# Patient Record
Sex: Female | Born: 1990 | Race: White | Hispanic: No | Marital: Married | Smoking: Former smoker
Health system: Southern US, Community
[De-identification: ages and names within clinical notes are randomized; demographics above are authoritative.]

## PROBLEM LIST (undated history)

## (undated) ENCOUNTER — Inpatient Hospital Stay: Payer: Self-pay

## (undated) DIAGNOSIS — O24419 Gestational diabetes mellitus in pregnancy, unspecified control: Secondary | ICD-10-CM

## (undated) DIAGNOSIS — F99 Mental disorder, not otherwise specified: Secondary | ICD-10-CM

## (undated) HISTORY — PX: WISDOM TOOTH EXTRACTION: SHX21

## (undated) HISTORY — DX: Gestational diabetes mellitus in pregnancy, unspecified control: O24.419

---

## 2013-04-04 DIAGNOSIS — Z8742 Personal history of other diseases of the female genital tract: Secondary | ICD-10-CM | POA: Insufficient documentation

## 2013-11-04 ENCOUNTER — Emergency Department: Payer: Self-pay | Admitting: Emergency Medicine

## 2013-11-04 LAB — CBC
HCT: 42.4 % (ref 35.0–47.0)
HGB: 13.8 g/dL (ref 12.0–16.0)
MCH: 27.9 pg (ref 26.0–34.0)
MCHC: 32.6 g/dL (ref 32.0–36.0)
MCV: 86 fL (ref 80–100)
PLATELETS: 337 10*3/uL (ref 150–440)
RBC: 4.95 10*6/uL (ref 3.80–5.20)
RDW: 14.3 % (ref 11.5–14.5)
WBC: 11.6 10*3/uL — ABNORMAL HIGH (ref 3.6–11.0)

## 2013-11-04 LAB — URINALYSIS, COMPLETE
BILIRUBIN, UR: NEGATIVE
Glucose,UR: NEGATIVE mg/dL (ref 0–75)
Ketone: NEGATIVE
Leukocyte Esterase: NEGATIVE
NITRITE: NEGATIVE
PROTEIN: NEGATIVE
Ph: 6 (ref 4.5–8.0)
Specific Gravity: 1.009 (ref 1.003–1.030)

## 2013-11-04 LAB — HCG, QUANTITATIVE, PREGNANCY: Beta Hcg, Quant.: 2190 m[IU]/mL — ABNORMAL HIGH

## 2013-11-13 ENCOUNTER — Emergency Department: Payer: Self-pay | Admitting: Emergency Medicine

## 2013-11-13 LAB — BASIC METABOLIC PANEL
Anion Gap: 2 — ABNORMAL LOW (ref 7–16)
BUN: 9 mg/dL (ref 7–18)
CALCIUM: 8.7 mg/dL (ref 8.5–10.1)
Chloride: 105 mmol/L (ref 98–107)
Co2: 29 mmol/L (ref 21–32)
Creatinine: 0.87 mg/dL (ref 0.60–1.30)
EGFR (African American): >60
EGFR (Non-African Amer.): >60
Glucose: 89 mg/dL (ref 65–99)
Osmolality: 270 (ref 275–301)
Potassium: 3.9 mmol/L (ref 3.5–5.1)
SODIUM: 136 mmol/L (ref 136–145)

## 2013-11-13 LAB — CBC
HCT: 41.8 % (ref 35.0–47.0)
HGB: 13.8 g/dL (ref 12.0–16.0)
MCH: 28.2 pg (ref 26.0–34.0)
MCHC: 33 g/dL (ref 32.0–36.0)
MCV: 86 fL (ref 80–100)
Platelet: 301 10*3/uL (ref 150–440)
RBC: 4.89 10*6/uL (ref 3.80–5.20)
RDW: 14 % (ref 11.5–14.5)
WBC: 8.5 10*3/uL (ref 3.6–11.0)

## 2013-11-13 LAB — HCG, QUANTITATIVE, PREGNANCY: BETA HCG, QUANT.: 21 m[IU]/mL — AB

## 2016-08-19 ENCOUNTER — Observation Stay
Admission: EM | Admit: 2016-08-19 | Discharge: 2016-08-19 | Disposition: A | Discharge status: Home / Self Care | Payer: Medicaid Other | Attending: Obstetrics and Gynecology | Admitting: Obstetrics and Gynecology

## 2016-08-19 ENCOUNTER — Observation Stay: Payer: Medicaid Other

## 2016-08-19 ENCOUNTER — Encounter: Payer: Self-pay | Admitting: *Deleted

## 2016-08-19 DIAGNOSIS — O219 Vomiting of pregnancy, unspecified: Secondary | ICD-10-CM | POA: Diagnosis present

## 2016-08-19 DIAGNOSIS — Z6841 Body Mass Index (BMI) 40.0 and over, adult: Secondary | ICD-10-CM | POA: Diagnosis not present

## 2016-08-19 DIAGNOSIS — O26892 Other specified pregnancy related conditions, second trimester: Secondary | ICD-10-CM | POA: Insufficient documentation

## 2016-08-19 DIAGNOSIS — E876 Hypokalemia: Secondary | ICD-10-CM | POA: Diagnosis not present

## 2016-08-19 DIAGNOSIS — R109 Unspecified abdominal pain: Secondary | ICD-10-CM | POA: Diagnosis present

## 2016-08-19 DIAGNOSIS — N1330 Unspecified hydronephrosis: Secondary | ICD-10-CM | POA: Insufficient documentation

## 2016-08-19 DIAGNOSIS — Z3A23 23 weeks gestation of pregnancy: Secondary | ICD-10-CM | POA: Diagnosis not present

## 2016-08-19 DIAGNOSIS — O99282 Endocrine, nutritional and metabolic diseases complicating pregnancy, second trimester: Secondary | ICD-10-CM | POA: Diagnosis not present

## 2016-08-19 DIAGNOSIS — N9489 Other specified conditions associated with female genital organs and menstrual cycle: Secondary | ICD-10-CM | POA: Diagnosis present

## 2016-08-19 DIAGNOSIS — O99212 Obesity complicating pregnancy, second trimester: Secondary | ICD-10-CM | POA: Insufficient documentation

## 2016-08-19 LAB — CBC
HEMATOCRIT: 34.8 % — AB (ref 35.0–47.0)
HEMOGLOBIN: 12.1 g/dL (ref 12.0–16.0)
MCH: 28.5 pg (ref 26.0–34.0)
MCHC: 34.9 g/dL (ref 32.0–36.0)
MCV: 81.8 fL (ref 80.0–100.0)
Platelets: 305 10*3/uL (ref 150–440)
RBC: 4.26 MIL/uL (ref 3.80–5.20)
RDW: 14.6 % — ABNORMAL HIGH (ref 11.5–14.5)
WBC: 15.3 10*3/uL — AB (ref 3.6–11.0)

## 2016-08-19 LAB — COMPREHENSIVE METABOLIC PANEL
ALK PHOS: 78 U/L (ref 38–126)
ALT: 21 U/L (ref 14–54)
ANION GAP: 9 (ref 5–15)
AST: 38 U/L (ref 15–41)
Albumin: 3.1 g/dL — ABNORMAL LOW (ref 3.5–5.0)
BILIRUBIN TOTAL: 1.1 mg/dL (ref 0.3–1.2)
BUN: 8 mg/dL (ref 6–20)
CALCIUM: 8.6 mg/dL — AB (ref 8.9–10.3)
CO2: 23 mmol/L (ref 22–32)
Chloride: 105 mmol/L (ref 101–111)
Creatinine, Ser: 1.07 mg/dL — ABNORMAL HIGH (ref 0.44–1.00)
GFR calc Af Amer: >60 mL/min (ref >60)
GFR calc non Af Amer: >60 mL/min (ref >60)
GLUCOSE: 99 mg/dL (ref 65–99)
Potassium: 3 mmol/L — ABNORMAL LOW (ref 3.5–5.1)
SODIUM: 137 mmol/L (ref 135–145)
Total Protein: 7 g/dL (ref 6.5–8.1)

## 2016-08-19 LAB — URINALYSIS, COMPLETE (UACMP) WITH MICROSCOPIC
Bilirubin Urine: NEGATIVE
Glucose, UA: NEGATIVE mg/dL
Hgb urine dipstick: NEGATIVE
KETONES UR: 20 mg/dL — AB
LEUKOCYTES UA: NEGATIVE
Nitrite: NEGATIVE
PH: 7 (ref 5.0–8.0)
PROTEIN: NEGATIVE mg/dL
Specific Gravity, Urine: 1.005 (ref 1.005–1.030)

## 2016-08-19 MED ORDER — KCL-LACTATED RINGERS-D5W 20 MEQ/L IV SOLN
INTRAVENOUS | Status: AC
  Administered 2016-08-19 (×2): 500 mL/h via INTRAVENOUS
  Filled 2016-08-19 (×4): qty 1000

## 2016-08-19 MED ORDER — PROMETHAZINE HCL 25 MG/ML IJ SOLN
25.0000 mg | Freq: Once | INTRAMUSCULAR | Status: AC
  Administered 2016-08-19: 25 mg via INTRAVENOUS
  Filled 2016-08-19 (×2): qty 1

## 2016-08-19 MED ORDER — LACTATED RINGERS IV SOLN
Freq: Once | INTRAVENOUS | Status: AC
  Administered 2016-08-19: 75 mL/h via INTRAVENOUS

## 2016-08-19 NOTE — OB Triage Note (Signed)
Call or return to [facility] with: ° °Strong, regular contractions °Leaking fluid °Decreased fetal movement °Vaginal bleeding that is heavy like a period °

## 2016-08-19 NOTE — Progress Notes (Signed)
Pt. off unit to radiology for U/S renal via radiology tech. Spouse at the bedside.

## 2016-08-19 NOTE — Discharge Summary (Signed)
  Suzy Bouchardhomas J Saige Busby, MD  Obstetrics    [] Hide copied text [] Hover for attribution information Patient ID: Erin HasteKathleen Tanimoto, female   DOB: 04/05/1991, 26 y.o.   MRN: [id number] Erin HasteKathleen Robb 01/04/1991 G3 P0 6512w4d presents for uterine cramping , n/v with 3 bouts of emesis. [facility] . Unassigned patient  noLOF , no vaginal bleeding , 38 hours of left flank pain . Poor po intake .  PMHX : obesity , h/o chlamydia , abn pap  PSHX : colposcopy , toe surgery  Pob Hx : SABx 2  Social No etoh , no tobacco  O;BP 129/60 (BP Location: Left Arm)   Pulse 83   Temp 98.4 F (36.9 C) (Oral)   Resp 18   Ht 5\' 3"  (1.6 m)   Wt 236 lb (107 kg)   BMI 41.81 kg/m  ABDsoft NT , no rebound CX long and closed , no blood  NST reassuring fetal monitoring 23 weeks  Labs:cbc: wbc =15K , HCT= 34.8% Cmp:K+=3.0, bun/cr 8/1.07, the rest is nl UA = neg  Renal u/s Mild hydronephrosis bilat A: Left flank and adb pain . No excessive hydronephrosis, no PTL  Hypokalemia with  Emesis Possible M/S etiology to left flank pain  P:IVF 1 liter with KCL  Add flexeril 10 mg TID prn flank pain  zofran 8 mg po q8 hrs prn n/v Add K+ rich foods  [facility]    Electronically signed by

## 2016-08-19 NOTE — OB Triage Note (Signed)
Pt. Here for worsening  abd. and back pain. Pain 7/10. Pain started around 1300 yesterday. Pt. Verbalized difficulty voiding, "I can only give you a few drops."  Pt. Also complaining of vomiting x4. Last sexual encounter was two nights ago. Denies sudden gush of fluid or vaginal bleeding. Positive for fetal movement.  EFM applied FHR 150 bpm, Toco applied - abd. Soft.

## 2016-08-19 NOTE — OB Triage Note (Signed)
Talked with MD about patient plan of care.  MD wrote prescription for zofran and inputted discharger orders.  He discussed plan of care with patient and she is in agreement.

## 2016-08-19 NOTE — Discharge Instructions (Signed)
Abdominal Pain During Pregnancy Belly (abdominal) pain is common during pregnancy. Most of the time, it is not a serious problem. Other times, it can be a sign that something is wrong with the pregnancy. Always tell your doctor if you have belly pain. Follow these instructions at home: Monitor your belly pain for any changes. The following actions may help you feel better:  Do not have sex (intercourse) or put anything in your vagina until you feel better.  Rest until your pain stops.  Drink clear fluids if you feel sick to your stomach (nauseous). Do not eat solid food until you feel better.  Only take medicine as told by your doctor.  Keep all doctor visits as told. Get help right away if:  You are bleeding, leaking fluid, or pieces of tissue come out of your vagina.  You have more pain or cramping.  You keep throwing up (vomiting).  You have pain when you pee (urinate) or have blood in your pee.  You have a fever.  You do not feel your baby moving as much.  You feel very weak or feel like passing out.  You have trouble breathing, with or without belly pain.  You have a very bad headache and belly pain.  You have fluid leaking from your vagina and belly pain.  You keep having watery poop (diarrhea).  Your belly pain does not go away after resting, or the pain gets worse. This information is not intended to replace advice given to you by your health care provider. Make sure you discuss any questions you have with your health care provider. Document Released: 07/14/2009 Document Revised: 03/03/2016 Document Reviewed: 02/22/2013 Elsevier Interactive Patient Education  2017 [employer]   Call or return to [facility] with:  Strong, regular contractions Leaking fluid Decreased fetal movement Vaginal bleeding that is heavy like a period

## 2016-08-19 NOTE — Progress Notes (Signed)
Patient ID: Erin Leon, female   DOB: 07/17/1991, 26 y.o.   MRN: [id number] Erin Leon 11/01/1990 G3 P0 156w4d presents for uterine cramping , n/v with 3 bouts of emesis. [facility] . Unassigned patient  noLOF , no vaginal bleeding , 38 hours of left flank pain . Poor po intake .  PMHX : obesity , h/o chlamydia , abn pap  PSHX : colposcopy , toe surgery  Pob Hx : SABx 2  Social No etoh , no tobacco  O;BP 129/60 (BP Location: Left Arm)   Pulse 83   Temp 98.4 F (36.9 C) (Oral)   Resp 18   Ht 5\' 3"  (1.6 m)   Wt 236 lb (107 kg)   BMI 41.81 kg/m  ABDsoft NT , no rebound CX long and closed , no blood  NST reassuring fetal monitoring 23 weeks  Labs:cbc: wbc =15K , HCT= 34.8% Cmp:K+=3.0, bun/cr 8/1.07, the rest is nl UA = neg  Renal u/s Mild hydronephrosis bilat A: Left flank and adb pain . No excessive hydronephrosis, no PTL  Hypokalemia with  Emesis Possible M/S etiology to left flank pain  P:IVF 1 liter with KCL  Add flexeril 10 mg TID prn flank pain  zofran 8 mg po q8 hrs prn n/v Add K+ rich foods  [facility]

## 2016-08-20 MED ORDER — ACETAMINOPHEN 500 MG PO TABS
ORAL_TABLET | ORAL | Status: AC
  Filled 2016-08-20: qty 1

## 2016-08-20 MED ORDER — FAMOTIDINE 20 MG PO TABS
ORAL_TABLET | ORAL | Status: AC
  Filled 2016-08-20: qty 1

## 2017-01-27 DIAGNOSIS — Z8659 Personal history of other mental and behavioral disorders: Secondary | ICD-10-CM | POA: Insufficient documentation

## 2017-06-13 ENCOUNTER — Encounter (HOSPITAL_COMMUNITY): Payer: Self-pay

## 2020-11-07 ENCOUNTER — Ambulatory Visit (LOCAL_COMMUNITY_HEALTH_CENTER): Payer: Self-pay

## 2020-11-07 ENCOUNTER — Other Ambulatory Visit: Payer: Self-pay

## 2020-11-07 DIAGNOSIS — Z111 Encounter for screening for respiratory tuberculosis: Secondary | ICD-10-CM

## 2020-11-10 ENCOUNTER — Ambulatory Visit (LOCAL_COMMUNITY_HEALTH_CENTER): Payer: Self-pay

## 2020-11-10 ENCOUNTER — Other Ambulatory Visit: Payer: Self-pay

## 2020-11-10 DIAGNOSIS — Z111 Encounter for screening for respiratory tuberculosis: Secondary | ICD-10-CM

## 2020-11-10 LAB — TB SKIN TEST
Induration: 0 mm
TB Skin Test: NEGATIVE

## 2021-07-29 ENCOUNTER — Other Ambulatory Visit: Payer: Self-pay | Admitting: Certified Nurse Midwife

## 2021-07-29 DIAGNOSIS — N63 Unspecified lump in unspecified breast: Secondary | ICD-10-CM

## 2021-08-09 NOTE — L&D Delivery Note (Signed)
Delivery Note  Erin Leon is a [id number] at [redacted]w[redacted]d, Patient's last menstrual period was 09/05/2021 (approximate)., consistent with [place] at [redacted]w[redacted]d.   First Stage: Labor onset: 1600 Induction: misoprostol, oxytocin, and AROM Analgesia /Anesthesia intrapartum: Epidural SROM at 1601 GBS: negative  Second Stage: Complete dilation at 2038 Onset of pushing at 2039 FHR second stage 120 bpm with moderate, variable and early decels with pushing   Erin Leon presented to L&D for scheduled IOL d/t GDM controlled with metformin.  Misoprostol was used for cervical ripening.  Oxytocin was stopped for 2 hours after 14 hours of infusion and minimal cervical change.  Slow labor progression started when oxytocin was reinitiated.  More active progression occurred after SROM during a cervical exam. She progressed to C/C/+2 with a urge to push.  Erin Leon pushed effectively over approximately 5 minutes for a spontaneous vaginal birth.  Delivery of a viable baby boy on 06/12/2022 at 2045 by CNM Delivery of fetal head in OA position with restitution to ROT. Loose nuchal cord x 1, easily reduced;  Anterior then posterior shoulders delivered easily with gentle downward traction. Baby placed on mom's chest, and attended to by baby RN Cord double clamped after cessation of pulsation, cut by father of baby  Cord blood sample collection: Yes O POS Performed at [facility], [address] [place], [place] [address]  Third Stage: Oxytocin bolus started after delivery of infant for hemorrhage prophylaxis  Placenta delivered intact with 3 VC @ 2055 Placenta disposition: discarded  Uterine tone firm / bleeding small  Small periurethral abrasion identified  Anesthesia for repair: N/A Repair not indicated  Est. Blood Loss (mL): 381 ml  Complications: None  Mom to postpartum.  Baby to Couplet care / Skin to Skin.  Newborn: Information for the patient's newborn:  Erin Leon, Erin Leon [[id number]]  Live born  female Erin Leon  Birth Weight:  pending  APGAR: 8, 9  Newborn Delivery   Birth date/time: 06/12/2022 20:45:00 Delivery type: Vaginal, Spontaneous     Feeding planned: breast feeding  ---------- Drinda Butts, CNM Certified Nurse Midwife [facility]

## 2021-08-13 ENCOUNTER — Other Ambulatory Visit: Payer: Self-pay

## 2021-08-13 ENCOUNTER — Ambulatory Visit
Admission: RE | Admit: 2021-08-13 | Discharge: 2021-08-13 | Disposition: A | Discharge status: Home / Self Care | Payer: 59 | Source: Ambulatory Visit | Attending: Certified Nurse Midwife | Admitting: Certified Nurse Midwife

## 2021-08-13 DIAGNOSIS — N63 Unspecified lump in unspecified breast: Secondary | ICD-10-CM

## 2021-11-05 DIAGNOSIS — O0993 Supervision of high risk pregnancy, unspecified, third trimester: Secondary | ICD-10-CM | POA: Insufficient documentation

## 2021-11-05 DIAGNOSIS — O09291 Supervision of pregnancy with other poor reproductive or obstetric history, first trimester: Secondary | ICD-10-CM | POA: Insufficient documentation

## 2021-12-03 DIAGNOSIS — O99213 Obesity complicating pregnancy, third trimester: Secondary | ICD-10-CM | POA: Insufficient documentation

## 2021-12-03 LAB — OB RESULTS CONSOLE HIV ANTIBODY (ROUTINE TESTING): HIV: NONREACTIVE

## 2021-12-03 LAB — OB RESULTS CONSOLE RUBELLA ANTIBODY, IGM: Rubella: IMMUNE

## 2021-12-03 LAB — OB RESULTS CONSOLE ABO/RH: RH Type: POSITIVE

## 2021-12-03 LAB — OB RESULTS CONSOLE VARICELLA ZOSTER ANTIBODY, IGG: Varicella: IMMUNE

## 2021-12-03 LAB — OB RESULTS CONSOLE GC/CHLAMYDIA
Chlamydia: NEGATIVE
Neisseria Gonorrhea: NEGATIVE

## 2021-12-03 LAB — OB RESULTS CONSOLE HEPATITIS B SURFACE ANTIGEN: Hepatitis B Surface Ag: NEGATIVE

## 2021-12-03 LAB — OB RESULTS CONSOLE RPR: RPR: NONREACTIVE

## 2022-01-08 DIAGNOSIS — O24415 Gestational diabetes mellitus in pregnancy, controlled by oral hypoglycemic drugs: Secondary | ICD-10-CM | POA: Insufficient documentation

## 2022-01-18 ENCOUNTER — Encounter: Payer: Self-pay | Admitting: *Deleted

## 2022-01-18 ENCOUNTER — Encounter: Payer: Medicaid Other | Admitting: *Deleted

## 2022-01-18 VITALS — BP 100/70 | Ht 63.0 in | Wt 243.8 lb

## 2022-01-18 DIAGNOSIS — O24410 Gestational diabetes mellitus in pregnancy, diet controlled: Secondary | ICD-10-CM | POA: Diagnosis not present

## 2022-01-18 DIAGNOSIS — Z713 Dietary counseling and surveillance: Secondary | ICD-10-CM | POA: Insufficient documentation

## 2022-01-18 DIAGNOSIS — Z3A21 21 weeks gestation of pregnancy: Secondary | ICD-10-CM | POA: Diagnosis not present

## 2022-01-18 NOTE — Patient Instructions (Signed)
Read booklet on Gestational Diabetes Follow Gestational Meal Planning Guidelines Don't skip meals Avoid sugar sweetened drinks (juice, soda) Complete a 3 Day Food Record and bring to next appointment Check blood sugars 4 x day - before breakfast and 2 hrs after every meal and record  Bring blood sugar log to all appointments Purchase urine ketone strips if instructed by MD and check urine ketones every am:  If + increase bedtime snack to 1 protein and 2 carbohydrate servings Walk 20-30 minutes at least 5 x week if permitted by MD

## 2022-01-18 NOTE — Progress Notes (Signed)
Diabetes Self-Management Education  Visit Type: First/Initial  Appt. Start Time: 1330 Appt. End Time: 1440  01/18/2022  [name], identified by name and date of birth, is a 31 y.o. female with a diagnosis of Diabetes: Gestational Diabetes.   ASSESSMENT  Blood pressure 100/70, height 5\' 3"  (1.6 m), weight 243 lb 12.8 oz (110.6 kg), last menstrual period 09/05/2021, estimated date of delivery 06/12/2022. Body mass index is 43.19 kg/m.   Diabetes Self-Management Education - 01/18/22 1521       Visit Information   Visit Type First/Initial      Initial Visit   Diabetes Type Gestational Diabetes    Date Diagnosed 1 week    Are you currently following a meal plan? Yes    What type of meal plan do you follow? "cut carbs down, choosing alternative breads and crusts"    Are you taking your medications as prescribed? Yes      Health Coping   How would you rate your overall health? Good      Psychosocial Assessment   Patient Belief/Attitude about Diabetes Other (comment)   "fine"   What is the hardest part about your diabetes right now, causing you the most concern, or is the most worrisome to you about your diabetes?   Making healty food and beverage choices    Self-care barriers None    Self-management support Doctor's office;Family    Patient Concerns Nutrition/Meal planning;Weight Control;Glycemic Control;Healthy Lifestyle    Special Needs None    Preferred Learning Style Visual;Hands on;Other (comment)   talking/discussion   What is the last grade level you completed in school? some college      Pre-Education Assessment   Patient understands the diabetes disease and treatment process. Needs Instruction    Patient understands incorporating nutritional management into lifestyle. Needs Instruction    Patient undertands incorporating physical activity into lifestyle. Needs Instruction    Patient understands using medications safely. Needs Instruction    Patient  understands monitoring blood glucose, interpreting and using results Needs Review    Patient understands prevention, detection, and treatment of acute complications. Needs Instruction    Patient understands prevention, detection, and treatment of chronic complications. Needs Instruction    Patient understands how to develop strategies to address psychosocial issues. Needs Instruction    Patient understands how to develop strategies to promote health/change behavior. Needs Instruction      Complications   Last HgB A1C per patient/outside source 5.6 %   12/03/2021   How often do you check your blood sugar? 3-4 times/day    Fasting Blood glucose range (mg/dL) 12/05/2021   73-710 mg/dL 5/6 elevated   Postprandial Blood glucose range (mg/dL) 62-694   pp breakfast 104 mg/dL - 0/1 elevated; pp lunch 96-126 mg/dL - 2/5 elevated; pp supper 102-148 mg/dL - 3/4 elevated   Have you had a dilated eye exam in the past 12 months? No    Have you had a dental exam in the past 12 months? Yes    Are you checking your feet? Yes    How many days per week are you checking your feet? 7      Dietary Intake   Breakfast skips    Snack (morning) 0-1 snacks/day veggies and dressing; pickles    Lunch frozen meals; 85-462;703-500 and cheese sandwich    Dinner some times Hello Fresh meals; chicken, steak, pork; potatoes, peas, beans, rice, corn, pasta, green beans, broccoli, cauliflower, squahs, zucchini, salads, cauliflower pizza  Beverage(s) water, juice, diet soda - recently stopped drinking Pepsi      Activity / Exercise   Activity / Exercise Type ADL's      Patient Education   Previous Diabetes Education No    Disease Pathophysiology Definition of diabetes, type 1 and 2, and the diagnosis of diabetes;Factors that contribute to the development of diabetes    Healthy Eating Role of diet in the treatment of diabetes and the relationship between the three main macronutrients and blood glucose level;Food label reading,  portion sizes and measuring food.;Reviewed blood glucose goals for pre and post meals and how to evaluate the patients' food intake on their blood glucose level.    Being Active Role of exercise on diabetes management, blood pressure control and cardiac health.    Medications Other (comment)   Limited use of oral medications during pregnancy and potential for insulin.   Monitoring Purpose and frequency of SMBG.;Taught/discussed recording of test results and interpretation of SMBG.;Identified appropriate SMBG and/or A1C goals.;Ketone testing, when, how.    Chronic complications Relationship between chronic complications and blood glucose control    Diabetes Stress and Support Identified and addressed patients feelings and concerns about diabetes    Preconception care Pregnancy and GDM  Role of pre-pregnancy blood glucose control on the development of the fetus;Reviewed with patient blood glucose goals with pregnancy;Role of family planning for patients with diabetes      Individualized Goals (developed by patient)   Reducing Risk Other (comment)   improve blood sugars, lose weight, lead a healthier lifestyle     Outcomes   Expected Outcomes Demonstrated interest in learning. Expect positive outcomes         Individualized Plan for Diabetes Self-Management Training:   Learning Objective:  Patient will have a greater understanding of diabetes self-management. Patient education plan is to attend individual and/or group sessions per assessed needs and concerns.   Plan:   Patient Instructions  Read booklet on Gestational Diabetes Follow Gestational Meal Planning Guidelines Don't skip meals Avoid sugar sweetened drinks (juice, soda) Complete a 3 Day Food Record and bring to next appointment Check blood sugars 4 x day - before breakfast and 2 hrs after every meal and record  Bring blood sugar log to all appointments Purchase urine ketone strips if instructed by MD and check urine ketones  every am:  If + increase bedtime snack to 1 protein and 2 carbohydrate servings Walk 20-30 minutes at least 5 x week if permitted by MD   Expected Outcomes:  Demonstrated interest in learning. Expect positive outcomes  Education material provided:  Gestational Booklet Gestational Meal Planning Guidelines Simple Meal Plan 3 Day Food Record Goals for a Healthy Pregnancy   If problems or questions, patient to contact team via:   Sharion Settler, RN, CCM, CDCES [phone]  Future DSME appointment: January 28, 2022 with the dietitian

## 2022-01-28 ENCOUNTER — Encounter: Payer: Self-pay | Admitting: Dietician

## 2022-01-28 ENCOUNTER — Encounter: Payer: Medicaid Other | Admitting: Dietician

## 2022-01-28 VITALS — BP 108/66 | Ht 63.0 in | Wt 244.2 lb

## 2022-01-28 DIAGNOSIS — O24410 Gestational diabetes mellitus in pregnancy, diet controlled: Secondary | ICD-10-CM

## 2022-01-28 DIAGNOSIS — Z713 Dietary counseling and surveillance: Secondary | ICD-10-CM | POA: Diagnosis not present

## 2022-01-28 NOTE — Patient Instructions (Signed)
Continue to practice portion control while on vacation, great job so far! Include at least a few minutes of light activity such as walking after meals to keep post-meal sugars in  normal range. Adjust amounts of protein foods and low carb veggies as needed to help with fulness at meals.

## 2022-05-24 LAB — OB RESULTS CONSOLE GC/CHLAMYDIA
Chlamydia: NEGATIVE
Neisseria Gonorrhea: NEGATIVE

## 2022-05-24 LAB — OB RESULTS CONSOLE GBS: GBS: NEGATIVE

## 2022-05-24 LAB — OB RESULTS CONSOLE HIV ANTIBODY (ROUTINE TESTING): HIV: NONREACTIVE

## 2022-05-24 LAB — OB RESULTS CONSOLE RPR: RPR: NONREACTIVE

## 2022-06-03 ENCOUNTER — Other Ambulatory Visit: Payer: Self-pay | Admitting: Certified Nurse Midwife

## 2022-06-03 DIAGNOSIS — Z3490 Encounter for supervision of normal pregnancy, unspecified, unspecified trimester: Secondary | ICD-10-CM

## 2022-06-03 NOTE — Progress Notes (Signed)
[id number] at [redacted]w[redacted]d, LMP of 09/05/21, c/w early [place] at 108w5d.  Scheduled for induction of labor for GDM on 06/11/22.   Prenatal provider: [facility] Pregnancy complicated by: GDM A1  Obesity History of IUGR History of PPD  Prenatal Labs: Blood type/Rh O Pos  Antibody screen neg  Rubella Immune    Varicella Immune  RPR NR  HBsAg   Nef  Hep C NR  HIV   NR  GC neg  Chlamydia neg  Genetic screening cfDNA negative  1 hour GTT N/A  3 hour GTT 97, 157, 187, 157  GBS   Neg   Tdap: 03/23/22 Flu: 06/03/22 Contraception: Vasectomy Feeding preference: breast feeding  ____ Avelino Leeds, CNM Certified Nurse Midwife [facility]

## 2022-06-11 ENCOUNTER — Other Ambulatory Visit: Payer: Self-pay

## 2022-06-11 ENCOUNTER — Inpatient Hospital Stay
Admission: RE | Admit: 2022-06-11 | Discharge: 2022-06-14 | DRG: 806 | Disposition: A | Discharge status: Home / Self Care | Payer: Medicaid Other

## 2022-06-11 ENCOUNTER — Encounter: Payer: Self-pay | Admitting: *Deleted

## 2022-06-11 DIAGNOSIS — O99893 Other specified diseases and conditions complicating puerperium: Secondary | ICD-10-CM | POA: Diagnosis not present

## 2022-06-11 DIAGNOSIS — O99214 Obesity complicating childbirth: Secondary | ICD-10-CM | POA: Diagnosis present

## 2022-06-11 DIAGNOSIS — I959 Hypotension, unspecified: Secondary | ICD-10-CM | POA: Diagnosis not present

## 2022-06-11 DIAGNOSIS — O9081 Anemia of the puerperium: Secondary | ICD-10-CM | POA: Diagnosis not present

## 2022-06-11 DIAGNOSIS — Z87891 Personal history of nicotine dependence: Secondary | ICD-10-CM | POA: Diagnosis not present

## 2022-06-11 DIAGNOSIS — O24425 Gestational diabetes mellitus in childbirth, controlled by oral hypoglycemic drugs: Principal | ICD-10-CM | POA: Diagnosis present

## 2022-06-11 DIAGNOSIS — D62 Acute posthemorrhagic anemia: Secondary | ICD-10-CM | POA: Diagnosis not present

## 2022-06-11 DIAGNOSIS — Z3A39 39 weeks gestation of pregnancy: Secondary | ICD-10-CM

## 2022-06-11 DIAGNOSIS — Z3490 Encounter for supervision of normal pregnancy, unspecified, unspecified trimester: Principal | ICD-10-CM | POA: Diagnosis present

## 2022-06-11 HISTORY — DX: Mental disorder, not otherwise specified: F99

## 2022-06-11 LAB — CBC
HCT: 36.9 % (ref 36.0–46.0)
Hemoglobin: 12.6 g/dL (ref 12.0–15.0)
MCH: 27.7 pg (ref 26.0–34.0)
MCHC: 34.1 g/dL (ref 30.0–36.0)
MCV: 81.1 fL (ref 80.0–100.0)
Platelets: 258 10*3/uL (ref 150–400)
RBC: 4.55 MIL/uL (ref 3.87–5.11)
RDW: 13.7 % (ref 11.5–15.5)
WBC: 9.4 10*3/uL (ref 4.0–10.5)
nRBC: 0 % (ref 0.0–0.2)

## 2022-06-11 LAB — GLUCOSE, CAPILLARY
Glucose-Capillary: 103 mg/dL — ABNORMAL HIGH (ref 70–99)
Glucose-Capillary: 95 mg/dL (ref 70–99)
Glucose-Capillary: 120 mg/dL — ABNORMAL HIGH (ref 70–99)

## 2022-06-11 LAB — TYPE AND SCREEN
ABO/RH(D): O POS
Antibody Screen: NEGATIVE

## 2022-06-11 LAB — ABO/RH: ABO/RH(D): O POS

## 2022-06-11 MED ORDER — MISOPROSTOL 200 MCG PO TABS
ORAL_TABLET | ORAL | Status: AC
  Filled 2022-06-11: qty 4

## 2022-06-11 MED ORDER — METFORMIN HCL 500 MG PO TABS
500.0000 mg | ORAL_TABLET | Freq: Every day | ORAL | Status: DC
  Administered 2022-06-11: 500 mg via ORAL
  Filled 2022-06-11: qty 1

## 2022-06-11 MED ORDER — PRENATAL MULTIVITAMIN CH
1.0000 | ORAL_TABLET | Freq: Every day | ORAL | Status: DC
  Administered 2022-06-11: 1 via ORAL

## 2022-06-11 MED ORDER — MISOPROSTOL 25 MCG QUARTER TABLET
ORAL_TABLET | ORAL | Status: AC
  Administered 2022-06-11: 25 ug via ORAL
  Filled 2022-06-11: qty 2

## 2022-06-11 MED ORDER — AMMONIA AROMATIC IN INHA
RESPIRATORY_TRACT | Status: AC
  Filled 2022-06-11: qty 10

## 2022-06-11 MED ORDER — LIDOCAINE HCL (PF) 1 % IJ SOLN: 30.0000 mL | INTRAMUSCULAR | Status: DC | PRN

## 2022-06-11 MED ORDER — MISOPROSTOL 25 MCG QUARTER TABLET
25.0000 ug | ORAL_TABLET | Freq: Once | ORAL | Status: AC
  Administered 2022-06-11: 25 ug via VAGINAL

## 2022-06-11 MED ORDER — LIDOCAINE HCL (PF) 1 % IJ SOLN
INTRAMUSCULAR | Status: AC
  Filled 2022-06-11: qty 30

## 2022-06-11 MED ORDER — OXYTOCIN 10 UNIT/ML IJ SOLN
INTRAMUSCULAR | Status: AC
  Filled 2022-06-11: qty 2

## 2022-06-11 MED ORDER — TERBUTALINE SULFATE 1 MG/ML IJ SOLN: 0.2500 mg | Freq: Once | INTRAMUSCULAR | Status: DC | PRN

## 2022-06-11 MED ORDER — FENTANYL CITRATE (PF) 100 MCG/2ML IJ SOLN
50.0000 ug | INTRAMUSCULAR | Status: DC | PRN
  Administered 2022-06-12 (×2): 100 ug via INTRAVENOUS
  Filled 2022-06-11 (×2): qty 2

## 2022-06-11 MED ORDER — MISOPROSTOL 25 MCG QUARTER TABLET: 25.0000 ug | ORAL_TABLET | Freq: Once | ORAL | Status: AC

## 2022-06-11 MED ORDER — MISOPROSTOL 25 MCG QUARTER TABLET
25.0000 ug | ORAL_TABLET | Freq: Once | ORAL | Status: AC
  Administered 2022-06-11: 25 ug via VAGINAL
  Filled 2022-06-11: qty 1

## 2022-06-11 MED ORDER — ACETAMINOPHEN 325 MG PO TABS
650.0000 mg | ORAL_TABLET | ORAL | Status: DC | PRN
  Administered 2022-06-11 (×2): 650 mg via ORAL
  Filled 2022-06-11 (×2): qty 2

## 2022-06-11 MED ORDER — PANTOPRAZOLE SODIUM 20 MG PO TBEC
20.0000 mg | DELAYED_RELEASE_TABLET | Freq: Every day | ORAL | Status: DC
  Administered 2022-06-11: 20 mg via ORAL
  Filled 2022-06-11: qty 1

## 2022-06-11 MED ORDER — SOD CITRATE-CITRIC ACID 500-334 MG/5ML PO SOLN: 30.0000 mL | ORAL | Status: DC | PRN

## 2022-06-11 MED ORDER — LACTATED RINGERS IV SOLN: INTRAVENOUS | Status: DC

## 2022-06-11 MED ORDER — LACTATED RINGERS IV SOLN: 500.0000 mL | INTRAVENOUS | Status: DC | PRN

## 2022-06-11 MED ORDER — MISOPROSTOL 25 MCG QUARTER TABLET
25.0000 ug | ORAL_TABLET | Freq: Once | ORAL | Status: AC
  Administered 2022-06-11: 25 ug via ORAL
  Filled 2022-06-11: qty 1

## 2022-06-11 MED ORDER — ONDANSETRON HCL 4 MG/2ML IJ SOLN
4.0000 mg | Freq: Four times a day (QID) | INTRAMUSCULAR | Status: DC | PRN
  Administered 2022-06-12 (×2): 4 mg via INTRAVENOUS
  Filled 2022-06-11 (×2): qty 2

## 2022-06-11 MED ORDER — OXYTOCIN BOLUS FROM INFUSION
333.0000 mL | Freq: Once | INTRAVENOUS | Status: AC
  Administered 2022-06-12: 333 mL via INTRAVENOUS

## 2022-06-11 MED ORDER — OXYTOCIN-SODIUM CHLORIDE 30-0.9 UT/500ML-% IV SOLN
1.0000 m[IU]/min | INTRAVENOUS | Status: DC
  Administered 2022-06-11 – 2022-06-12 (×2): 2 m[IU]/min via INTRAVENOUS
  Filled 2022-06-11: qty 500

## 2022-06-11 MED ORDER — OXYTOCIN-SODIUM CHLORIDE 30-0.9 UT/500ML-% IV SOLN: 2.5000 [IU]/h | INTRAVENOUS | Status: DC

## 2022-06-11 MED ORDER — OXYTOCIN-SODIUM CHLORIDE 30-0.9 UT/500ML-% IV SOLN
INTRAVENOUS | Status: AC
  Filled 2022-06-11: qty 500

## 2022-06-11 NOTE — H&P (Signed)
OB History & Physical   History of Present Illness:  Chief Complaint:   HPI:  Erin Leon is a 31 y.o. [id number] female at [redacted]w[redacted]d dated by LMP of 09/05/21.  She presents to L&D for IOL for GDM  She reports:  -active fetal movement -no leakage of fluid  -no vaginal bleeding -no contractions  Pregnancy Issues: 1. GDM A1           Obesity History of IUGR History of PPD   Maternal Medical History:   Past Medical History:  Diagnosis Date   Gestational diabetes    Mental disorder     Past Surgical History:  Procedure Laterality Date   WISDOM TOOTH EXTRACTION      No Known Allergies  Prior to Admission medications   Medication Sig Start Date End Date Taking? Authorizing Provider  ACCU-CHEK GUIDE test strip 4 (four) times daily. 01/08/22  Yes [provider]  cetirizine (ZYRTEC) 10 MG tablet Take 10 mg by mouth daily.   Yes [provider]  metFORMIN (GLUCOPHAGE) 500 MG tablet Take by mouth. 01/28/22 01/28/23 Yes [provider]  omeprazole (PRILOSEC) 20 MG capsule Take 20 mg by mouth daily.   Yes [provider]  Prenatal Vit-Fe Fumarate-FA (MULTIVITAMIN-PRENATAL) 27-0.8 MG TABS tablet Take 1 tablet by mouth daily at 12 noon.   Yes [provider]     Prenatal care site: [place] History: She  reports that she quit smoking about 8 years ago. Her smoking use included cigarettes. She has a 0.50 pack-year smoking history. She has quit using smokeless tobacco. She reports that she does not drink alcohol and does not use drugs.  Family History: family history includes Breast cancer in her maternal grandfather and paternal grandmother; Diabetes in her maternal aunt and paternal grandfather.   Review of Systems: A full review of systems was performed and negative except as noted in the HPI.    Physical Exam:  Vital Signs: Ht 5\' 3"  (1.6 m)   Wt 111 kg   LMP 09/05/2021 (Approximate)   BMI 43.36 kg/m   General:    alert and cooperative  Skin:  normal  Neurologic:    Alert & oriented x 3  Lungs:    Nl effort  Heart:   regular rate and rhythm  Abdomen:  soft, non-tender; bowel sounds normal; no masses,  no organomegaly  Extremities: : non-tender, symmetric, no edema bilaterally.      EFW: 05/12/22: 5lb15oz(2693g)= 64%   Results for orders placed or performed during the hospital encounter of 06/11/22 (from the past 24 hour(s))  Glucose, capillary     Status: Abnormal   Collection Time: 06/11/22 10:12 AM  Result Value Ref Range   Glucose-Capillary 103 (H) 70 - 99 mg/dL  CBC     Status: None   Collection Time: 06/11/22 10:18 AM  Result Value Ref Range   WBC 9.4 4.0 - 10.5 K/uL   RBC 4.55 3.87 - 5.11 MIL/uL   Hemoglobin 12.6 12.0 - 15.0 g/dL   HCT 36.9 36.0 - 46.0 %   MCV 81.1 80.0 - 100.0 fL   MCH 27.7 26.0 - 34.0 pg   MCHC 34.1 30.0 - 36.0 g/dL   RDW 13.7 11.5 - 15.5 %   Platelets 258 150 - 400 K/uL   nRBC 0.0 0.0 - 0.2 %    Pertinent Results:  Prenatal Labs: Blood type/Rh O pos  Antibody screen neg  Rubella Immune  Varicella Immune  RPR NR  HBsAg Neg  HIV NR  GC neg  Chlamydia neg  Genetic screening negative  1 hour GTT   3 hour GTT   GBS Neg   FHT: FHR: 130 bpm, variability: moderate,  accelerations:  Present,  decelerations:  Absent Category/reactivity:  Category I TOCO: none SVE: Dilation: 1 / Effacement (%): 50 / Station: -3     Assessment:  Erin Leon is a 31 y.o. [id number] female at [redacted]w[redacted]d with GDM.   Plan:  1. Admit to Labor & Delivery; consents reviewed and obtained  2. Fetal Well being  - Fetal Tracing: Cat I - GBS neg - Presentation: vtx confirmed by sve   3. Routine OB: - Prenatal labs reviewed, as above - Rh pos - CBC & T&S on admit - Clear fluids, IVF  4. Induction of Labor -  Contractions by external toco in place -  Pelvis proven to 3030g -  Plan for induction with Cytotec and Pitocin -  Plan for continuous fetal monitoring  -  Maternal  pain control as desired: IVPM, nitrous, regional anesthesia - Anticipate vaginal delivery  5. Post Partum Planning: - Infant feeding: Breast - Contraception: Vasectomy - Tdap: given 03/23/22  - Flu: given 06/03/22   Linda Hedges, CNM 06/11/2022 10:43 AM

## 2022-06-11 NOTE — Progress Notes (Signed)
Labor Progress Note  Erin Leon is a 31 y.o. [id number] at [redacted]w[redacted]d by LMP admitted for induction of labor due to Gestational diabetes.  Subjective: Pt is reporting some mild UCs   Objective: BP 119/70 (BP Location: Left Arm)   Pulse 62   Temp 98.6 F (37 C) (Oral)   Resp 18   Ht 5\' 3"  (1.6 m)   Wt 111 kg   LMP 09/05/2021 (Approximate)   BMI 43.36 kg/m   Fetal Assessment: FHT:  FHR: 135 bpm, variability: moderate,  accelerations:  Present,  decelerations:  Absent Category/reactivity:  Category I UC:   regular, every 2-4 minutes SVE:    Dilation: 1cm  Effacement: 50%  Station:  -3  Consistency: soft  Position: posterior  Membrane status: Intact Amniotic color: n/a  Labs: Lab Results  Component Value Date   WBC 9.4 06/11/2022   HGB 12.6 06/11/2022   HCT 36.9 06/11/2022   MCV 81.1 06/11/2022   PLT 258 06/11/2022    Assessment / Plan: Induction of labor due to gestational diabetes 1030 68mcg PO and 48mcg PV 1525 103mcg PO and 74mcg PV  Labor: Progressing normally Preeclampsia:   122/76 Fetal Wellbeing:  Category I Pain Control:  Labor support without medications I/D:   Afebrile, GBS neg, Intact Anticipated MOD:  NSVD  Erin Leon, CNM 06/11/2022, 7:59 PM

## 2022-06-11 NOTE — Progress Notes (Signed)
Labor Progress Note  Erin Leon is a 31 y.o. [id number] at [redacted]w[redacted]d by LMP admitted for induction of labor due to Gestational diabetes.  Subjective: Pt is reporting some mild UCs   Objective: BP 119/70 (BP Location: Left Arm)   Pulse 62   Temp 98.6 F (37 C) (Oral)   Resp 18   Ht 5\' 3"  (1.6 m)   Wt 111 kg   LMP 09/05/2021 (Approximate)   BMI 43.36 kg/m   Fetal Assessment: FHT:  FHR: 140 bpm, variability: moderate,  accelerations:  Present,  decelerations:  Absent Category/reactivity:  Category I UC:   UI SVE:    Dilation: 1cm  Effacement: 50%  Station:  -3  Consistency: soft  Position: posterior  Membrane status: Intact Amniotic color: n/a  Labs: Lab Results  Component Value Date   WBC 9.4 06/11/2022   HGB 12.6 06/11/2022   HCT 36.9 06/11/2022   MCV 81.1 06/11/2022   PLT 258 06/11/2022    Assessment / Plan: Induction of labor due to gestational diabetes 1030 31mcg PO and 51mcg PV 1525 41mcg PO and 81mcg PV Will allow pt to shower before starting Pitocin  Labor: Progressing normally Preeclampsia:   119/70 Fetal Wellbeing:  Category I Pain Control:  Labor support without medications I/D:   Afebrile, GBS neg, Intact Anticipated MOD:  NSVD  Clydene Laming, CNM 06/11/2022, 8:05 PM

## 2022-06-12 ENCOUNTER — Inpatient Hospital Stay: Payer: Medicaid Other | Admitting: Anesthesiology

## 2022-06-12 ENCOUNTER — Encounter: Payer: Self-pay | Admitting: Obstetrics and Gynecology

## 2022-06-12 LAB — GLUCOSE, CAPILLARY
Glucose-Capillary: 95 mg/dL (ref 70–99)
Glucose-Capillary: 83 mg/dL (ref 70–99)
Glucose-Capillary: 81 mg/dL (ref 70–99)
Glucose-Capillary: 89 mg/dL (ref 70–99)

## 2022-06-12 MED ORDER — PHENYLEPHRINE 80 MCG/ML (10ML) SYRINGE FOR IV PUSH (FOR BLOOD PRESSURE SUPPORT): 80.0000 ug | PREFILLED_SYRINGE | INTRAVENOUS | Status: DC | PRN

## 2022-06-12 MED ORDER — LIDOCAINE-EPINEPHRINE (PF) 1.5 %-1:200000 IJ SOLN
INTRAMUSCULAR | Status: DC | PRN
  Administered 2022-06-12: 3 mL via EPIDURAL

## 2022-06-12 MED ORDER — SODIUM CHLORIDE 0.9 % IV SOLN
INTRAVENOUS | Status: DC | PRN
  Administered 2022-06-12 (×2): 5 mL via EPIDURAL

## 2022-06-12 MED ORDER — LIDOCAINE HCL (PF) 1 % IJ SOLN
INTRAMUSCULAR | Status: DC | PRN
  Administered 2022-06-12: 3 mL via SUBCUTANEOUS
  Administered 2022-06-12: 2 mL via SUBCUTANEOUS

## 2022-06-12 MED ORDER — EPHEDRINE 5 MG/ML INJ: 10.0000 mg | INTRAVENOUS | Status: DC | PRN

## 2022-06-12 MED ORDER — LACTATED RINGERS IV SOLN
500.0000 mL | Freq: Once | INTRAVENOUS | Status: AC
  Administered 2022-06-12: 500 mL via INTRAVENOUS

## 2022-06-12 MED ORDER — PHENYLEPHRINE 80 MCG/ML (10ML) SYRINGE FOR IV PUSH (FOR BLOOD PRESSURE SUPPORT)
PREFILLED_SYRINGE | INTRAVENOUS | Status: AC
  Administered 2022-06-12: 160 ug via INTRAVENOUS
  Filled 2022-06-12: qty 10

## 2022-06-12 MED ORDER — FENTANYL-BUPIVACAINE-NACL 0.5-0.125-0.9 MG/250ML-% EP SOLN
12.0000 mL/h | EPIDURAL | Status: DC | PRN
  Administered 2022-06-12: 12 mL/h via EPIDURAL

## 2022-06-12 MED ORDER — DIPHENHYDRAMINE HCL 50 MG/ML IJ SOLN: 12.5000 mg | INTRAMUSCULAR | Status: DC | PRN

## 2022-06-12 MED ORDER — CALCIUM CARBONATE ANTACID 500 MG PO CHEW
2.0000 | CHEWABLE_TABLET | Freq: Three times a day (TID) | ORAL | Status: DC | PRN
  Administered 2022-06-12: 400 mg via ORAL
  Filled 2022-06-12: qty 2

## 2022-06-12 MED ORDER — FENTANYL-BUPIVACAINE-NACL 0.5-0.125-0.9 MG/250ML-% EP SOLN
EPIDURAL | Status: AC
  Filled 2022-06-12: qty 250

## 2022-06-12 NOTE — Progress Notes (Addendum)
L&D Note    Subjective:  Recently received epidural, still feeling contractions but pain is starting to get better   Objective:   Vitals:   06/12/22 1855 06/12/22 1856 06/12/22 1900 06/12/22 1919  BP:  (!) 143/77 (!) 143/73 (!) 128/105  Pulse:  80 92 79  Resp:      Temp:      TempSrc:      SpO2: 99%  99%   Weight:      Height:        Current Vital Signs 24h Vital Sign Ranges  T 98.7 F (37.1 C) Temp  Avg: 98.6 F (37 C)  Min: 98.1 F (36.7 C)  Max: 98.9 F (37.2 C)  BP (!) 128/105 BP  Min: 113/50  Max: 143/73  HR 79 Pulse  Avg: 71.3  Min: 57  Max: 92  RR 16 Resp  Avg: 17.6  Min: 16  Max: 18  SaO2 99 %   SpO2  Avg: 99 %  Min: 99 %  Max: 99 %      Gen: alert, cooperative, no distress FHR: Baseline: 125 bpm, Variability: moderate, Accels: Present, Decels: prolong decel after epidural Toco: regular, every 2-4 minutes SVE: Dilation: 6.5 Effacement (%): 90 Cervical Position: Anterior Station: -1 Presentation: Vertex Exam by:: Drinda Butts, CNM  Medications SCHEDULED MEDICATIONS   metFORMIN  500 mg Oral Daily   oxytocin 40 units in LR 1000 mL  333 mL Intravenous Once   pantoprazole  20 mg Oral Daily   prenatal multivitamin  1 tablet Oral Q1200    MEDICATION INFUSIONS   fentaNYL 2 mcg/mL w/bupivacaine 0.125% in NS 250 mL 12 mL/hr (06/12/22 1905)   lactated ringers     lactated ringers 125 mL/hr at 06/12/22 1949   oxytocin     oxytocin 7 milli-units/min (06/12/22 1932)    PRN MEDICATIONS  acetaminophen, calcium carbonate, diphenhydrAMINE, ePHEDrine, ePHEDrine, fentaNYL (SUBLIMAZE) injection, fentaNYL 2 mcg/mL w/bupivacaine 0.125% in NS 250 mL, lactated ringers, lidocaine (PF), ondansetron, phenylephrine, phenylephrine, sodium citrate-citric acid, terbutaline, terbutaline   Assessment & Plan:  31 y.o. [id number] at [redacted]w[redacted]d admitted for IOL d/t GDM controlled with metformin  -Labor: Active phase labor. -Fetal Well-being: Category II -> Category I -Oxytocin turned off during  prolong decel - restarted at 2 milliunits/min -Overall reassuring tracing with spontaneous return to baseline with minimal interventions and management of hypotension post-epidural  -GBS: negative -Analgesia: regional anesthesia - significant hypotension noted post-epidural.  BP improved with Phenylephrine  -Dr. Ouida Sills updated on labor progress  Minda Meo, CNM  06/12/2022 8:18 PM  [facility]

## 2022-06-12 NOTE — Progress Notes (Signed)
Labor Progress Note  Erin Leon is a 31 y.o. [id number] at [redacted]w[redacted]d by LMP admitted for induction of labor due to Gestational diabetes.  Subjective: Pt reports more intense UCs but still mild  Objective: BP 125/66 (BP Location: Right Wrist)   Pulse (!) 59   Temp 98.9 F (37.2 C) (Oral)   Resp 18   Ht 5\' 3"  (1.6 m)   Wt 111 kg   LMP 09/05/2021 (Approximate)   BMI 43.36 kg/m   Fetal Assessment: FHT:  FHR: 135 bpm, variability: moderate,  accelerations:  Present,  decelerations:  Absent Category/reactivity:  Category I UC:   regular, every 2-4 min SVE:    Dilation: 1cm  Effacement: 50%  Station:  -3  Consistency: soft  Position: posterior  Membrane status: Intact Amniotic color: n/a  Labs: Lab Results  Component Value Date   WBC 9.4 06/11/2022   HGB 12.6 06/11/2022   HCT 36.9 06/11/2022   MCV 81.1 06/11/2022   PLT 258 06/11/2022    Assessment / Plan: Induction of labor due to gestational diabetes 1030 71mcg PO and 22mcg PV 1525 69mcg PO and 36mcg PV 2048 Pitocin started  Pitocin currently at 33mU  Labor: Progressing normally Preeclampsia:   125/66 Fetal Wellbeing:  Category I Pain Control:  Labor support without medications I/D:   Afebrile, GBS neg, Intact Anticipated MOD:  NSVD  Jenifer E Million Maharaj, CNM 06/12/2022, 1:14 AM

## 2022-06-12 NOTE — Anesthesia Procedure Notes (Signed)
Epidural Patient location during procedure: OB Start time: 06/12/2022 6:44 PM End time: 06/12/2022 6:54 PM  Staffing Anesthesiologist: Martha Clan, MD Performed: anesthesiologist   Preanesthetic Checklist Completed: patient identified, IV checked, site marked, risks and benefits discussed, surgical consent, monitors and equipment checked, pre-op evaluation and timeout performed  Epidural Patient position: sitting Prep: ChloraPrep Patient monitoring: heart rate, continuous pulse ox and blood pressure Approach: midline Location: L3-L4 Injection technique: LOR saline  Needle:  Needle type: Tuohy  Needle gauge: 17 G Needle length: 9 cm Needle insertion depth: 6 cm Catheter type: closed end flexible Catheter size: 19 Gauge Catheter at skin depth: 11 cm Test dose: negative and 1.5% lidocaine with Epi 1:200 K  Assessment Sensory level: T10 Events: blood not aspirated, injection not painful, no injection resistance, no paresthesia and negative IV test  Additional Notes 1st attempt Pt. Evaluated and documentation done after procedure finished. Patient identified. Risks/Benefits/Options discussed with patient including but not limited to bleeding, infection, nerve damage, paralysis, failed block, incomplete pain control, headache, blood pressure changes, nausea, vomiting, reactions to medication both or allergic, itching and postpartum back pain. Confirmed with bedside nurse the patient's most recent platelet count. Confirmed with patient that they are not currently taking any anticoagulation, have any bleeding history or any family history of bleeding disorders. Patient expressed understanding and wished to proceed. All questions were answered. Sterile technique was used throughout the entire procedure. Please see nursing notes for vital signs. Test dose was given through epidural catheter and negative prior to continuing to dose epidural or start infusion. Warning signs of high block  given to the patient including shortness of breath, tingling/numbness in hands, complete motor block, or any concerning symptoms with instructions to call for help. Patient was given instructions on fall risk and not to get out of bed. All questions and concerns addressed with instructions to call with any issues or inadequate analgesia.   Patient tolerated the insertion well without immediate complications.Reason for block:procedure for pain

## 2022-06-12 NOTE — Anesthesia Preprocedure Evaluation (Signed)
Anesthesia Evaluation  Patient identified by MRN, date of birth, ID band Patient awake    Reviewed: Allergy & Precautions, H&P , NPO status , Patient's Chart, lab work & pertinent test results, reviewed documented beta blocker date and time   History of Anesthesia Complications Negative for: history of anesthetic complications  Airway Mallampati: I  TM Distance: >3 FB Neck ROM: full    Dental no notable dental hx.    Pulmonary neg pulmonary ROS, former smoker   Pulmonary exam normal breath sounds clear to auscultation       Cardiovascular Exercise Tolerance: Good negative cardio ROS Normal cardiovascular exam Rhythm:regular Rate:Normal     Neuro/Psych negative neurological ROS  negative psych ROS   GI/Hepatic Neg liver ROS,GERD  ,,  Endo/Other  diabetes, Gestational  Morbid obesity  Renal/GU negative Renal ROS  negative genitourinary   Musculoskeletal   Abdominal   Peds  Hematology negative hematology ROS (+)   Anesthesia Other Findings Past Medical History: No date: Gestational diabetes No date: Mental disorder   Reproductive/Obstetrics (+) Pregnancy                             Anesthesia Physical Anesthesia Plan  ASA: 3  Anesthesia Plan: Epidural   Post-op Pain Management:    Induction:   PONV Risk Score and Plan:   Airway Management Planned:   Additional Equipment:   Intra-op Plan:   Post-operative Plan:   Informed Consent: I have reviewed the patients History and Physical, chart, labs and discussed the procedure including the risks, benefits and alternatives for the proposed anesthesia with the patient or authorized representative who has indicated his/her understanding and acceptance.     Dental Advisory Given  Plan Discussed with: Anesthesiologist, CRNA and Surgeon  Anesthesia Plan Comments:        Anesthesia Quick Evaluation

## 2022-06-12 NOTE — Discharge Instructions (Signed)
Vaginal Delivery, Care After Refer to this sheet in the next few weeks. These discharge instructions provide you with information on caring for yourself after delivery. Your caregiver may also give you specific instructions. Your treatment has been planned according to the most current medical practices available, but problems sometimes occur. Call your caregiver if you have any problems or questions after you go home. HOME CARE INSTRUCTIONS Take over-the-counter or prescription medicines only as directed by your caregiver or pharmacist. Do not drink alcohol, especially if you are breastfeeding or taking medicine to relieve pain. Do not smoke tobacco. Continue to use good perineal care. Good perineal care includes: Wiping your perineum from back to front Keeping your perineum clean. You can do sitz baths twice a day, to help keep this area clean Do not use tampons, douche or have sex until your caregiver says it is okay. Shower only and avoid sitting in submerged water, aside from sitz baths Wear a well-fitting bra that provides breast support. Eat healthy foods. Drink enough fluids to keep your urine clear or pale yellow. Eat high-fiber foods such as whole grain cereals and breads, brown rice, beans, and fresh fruits and vegetables every day. These foods may help prevent or relieve constipation. Avoid constipation with high fiber foods or medications, such as miralax or metamucil Follow your caregiver's recommendations regarding resumption of activities such as climbing stairs, driving, lifting, exercising, or traveling. Talk to your caregiver about resuming sexual activities. Resumption of sexual activities is dependent upon your risk of infection, your rate of healing, and your comfort and desire to resume sexual activity. Try to have someone help you with your household activities and your newborn for at least a few days after you leave the hospital. Rest as much as possible. Try to rest or  take a nap when your newborn is sleeping. Increase your activities gradually. Keep all of your scheduled postpartum appointments. It is very important to keep your scheduled follow-up appointments. At these appointments, your caregiver will be checking to make sure that you are healing physically and emotionally. SEEK MEDICAL CARE IF:  You are passing large clots from your vagina. Save any clots to show your caregiver. You have a foul smelling discharge from your vagina. You have trouble urinating. You are urinating frequently. You have pain when you urinate. You have a change in your bowel movements. You have increasing redness, pain, or swelling near your vaginal incision (episiotomy) or vaginal tear. You have pus draining from your episiotomy or vaginal tear. Your episiotomy or vaginal tear is separating. You have painful, hard, or reddened breasts. You have a severe headache. You have blurred vision or see spots. You feel sad or depressed. You have thoughts of hurting yourself or your newborn. You have questions about your care, the care of your newborn, or medicines. You are dizzy or light-headed. You have a rash. You have nausea or vomiting. You were breastfeeding and have not had a menstrual period within 12 weeks after you stopped breastfeeding. You are not breastfeeding and have not had a menstrual period by the 12th week after delivery. You have a fever. SEEK IMMEDIATE MEDICAL CARE IF:  You have persistent pain. You have chest pain. You have shortness of breath. You faint. You have leg pain. You have stomach pain. Your vaginal bleeding saturates two or more sanitary pads in 1 hour. MAKE SURE YOU:  Understand these instructions. Will watch your condition. Will get help right away if you are not doing well or   get worse. Document Released: 07/23/2000 Document Revised: 12/10/2013 Document Reviewed: 03/22/2012 ExitCare Patient Information 2015 [employer]. This  information is not intended to replace advice given to you by your health care provider. Make sure you discuss any questions you have with your health care provider.  Sitz Bath A sitz bath is a warm water bath taken in the sitting position. The water covers only the hips and butt (buttocks). We recommend using one that fits in the toilet, to help with ease of use and cleanliness. It may be used for either healing or cleaning purposes. Sitz baths are also used to relieve pain, itching, or muscle tightening (spasms). The water may contain medicine. Moist heat will help you heal and relax.  HOME CARE  Take 3 to 4 sitz baths a day. Fill the bathtub half-full with warm water. Sit in the water and open the drain a little. Turn on the warm water to keep the tub half-full. Keep the water running constantly. Soak in the water for 15 to 20 minutes. After the sitz bath, pat the affected area dry. GET HELP RIGHT AWAY IF: You get worse instead of better. Stop the sitz baths if you get worse. MAKE SURE YOU: Understand these instructions. Will watch your condition. Will get help right away if you are not doing well or get worse. Document Released: 09/02/2004 Document Revised: 04/19/2012 Document Reviewed: 11/23/2010 Exit[employer] Patient Information 2015 [employer]. This information is not intended to replace advice given to you by your health care provider. Make sure you discuss any questions you have with your health care provider.   

## 2022-06-12 NOTE — Discharge Summary (Signed)
Obstetrical Discharge Summary  Patient Name: Erin Leon DOB: 11/06/1990 MRN: [id number]  Date of Admission: 06/11/2022 Date of Delivery: 06/12/2022 Delivered by: Drinda Butts, CNM  Date of Discharge: 06/14/2022  Primary OB: [facility] [id number] last menstrual period was 09/05/2021 (approximate). EDC Estimated Date of Delivery: 06/12/22 Gestational Age at Delivery: [redacted]w[redacted]d   Antepartum complications:  1. GDM A1           Obesity History of IUGR History of PPD  Admitting Diagnosis: Encounter for planned induction of labor [Z34.90]  Secondary Diagnosis: Patient Active Problem List   Diagnosis Date Noted   NSVD (normal spontaneous vaginal delivery) 06/14/2022   History of IUGR (intrauterine growth retardation) and stillbirth, currently pregnant, first trimester 11/05/2021   Supervision of high risk pregnancy in third trimester 11/05/2021   History of postpartum depression 01/27/2017   History of abnormal cervical Pap smear 04/04/2013    Discharge Diagnosis: Term Pregnancy Delivered      Induction: AROM, Pitocin, and Cytotec Complications: None Intrapartum complications/course:  Erin Leon presented to L&D for scheduled IOL d/t GDM controlled with metformin.  Misoprostol was used for cervical ripening.  Oxytocin was stopped for 2 hours after 14 hours of infusion and minimal cervical change.  Slow labor progression started when oxytocin was reinitiated.  More active progression occurred after SROM during a cervical exam. She progressed to C/C/+2 with a urge to push.  Erin Leon pushed effectively over approximately 5 minutes for a spontaneous vaginal birth.  Delivery Type: spontaneous vaginal delivery Anesthesia: epidural anesthesia Placenta: spontaneous To Pathology: No  Laceration: small periurethral abrasion  Episiotomy: none Newborn Data: Live born female "[name]" Birth Weight:  3440g (7lb 9.3oz)  APGAR: 8, 9   Newborn Delivery   Birth date/time: 06/12/2022  20:45:00 Delivery type: Spontaneous      Postpartum Procedures: none Edinburgh:     06/13/2022    8:41 AM  Erin Leon Postnatal Depression Scale Screening Tool  I have been able to laugh and see the funny side of things. 0  I have looked forward with enjoyment to things. 0  I have blamed myself unnecessarily when things went wrong. 1  I have been anxious or worried for no good reason. 0  I have felt scared or panicky for no good reason. 0  Things have been getting on top of me. 0  I have been so unhappy that I have had difficulty sleeping. 0  I have felt sad or miserable. 0  I have been so unhappy that I have been crying. 0  The thought of harming myself has occurred to me. 0  Edinburgh Postnatal Depression Scale Total 1     Post partum course:  Patient had an uncomplicated postpartum course.  By time of discharge on PPD#2, her pain was controlled on oral pain medications; she had appropriate lochia and was ambulating, voiding without difficulty and tolerating regular diet.  She was deemed stable for discharge to home.    Discharge Physical Exam:  BP 138/76 (BP Location: Right Arm)   Pulse 65   Temp 98.6 F (37 C) (Oral)   Resp 20   Ht 5\' 3"  (1.6 m)   Wt 111 kg   LMP 09/05/2021 (Approximate)   SpO2 99%   Breastfeeding Unknown   BMI 43.36 kg/m   General: NAD CV: RRR Pulm: nl effort ABD: s/nd/nt, fundus firm and below the umbilicus Lochia: moderate Perineum: minimal edema/intact DVT Evaluation: LE non-ttp, no evidence of DVT on exam.  Hemoglobin  Date Value Ref  Range Status  06/13/2022 10.5 (L) 12.0 - 15.0 g/dL Final   HGB  Date Value Ref Range Status  11/13/2013 13.8 12.0 - 16.0 g/dL Final   HCT  Date Value Ref Range Status  06/13/2022 30.9 (L) 36.0 - 46.0 % Final  11/13/2013 41.8 35.0 - 47.0 % Final    Risk assessment for postpartum VTE and prophylactic treatment: Very high risk factors: None High risk factors: BMI 40-50 kg/m2 Moderate risk factors:  None  Postpartum VTE prophylaxis with LMWH not indicated  Disposition: stable, discharge to home. Baby Feeding: breast and formula feeding Baby Disposition: home with mom  Rh Immune globulin indicated: No Rubella vaccine given: was not indicated Varivax vaccine given: was not indicated Flu vaccine given in AP setting: Yes  Tdap vaccine given in AP setting: Yes   Contraception: vasectomy  Prenatal Labs:  Blood type/Rh O pos  Antibody screen neg  Rubella Immune  Varicella Immune  RPR NR  HBsAg Neg  HIV NR  GC neg  Chlamydia neg  Genetic screening negative  1 hour GTT    3 hour GTT    GBS Neg    Plan:  Erin Leon was discharged to home in good condition. Follow-up appointment with delivering provider in 2 weeks.  Discharge Medications: Allergies as of 06/14/2022   No Known Allergies      Medication List     TAKE these medications    cetirizine 10 MG tablet Commonly known as: ZYRTEC Take 10 mg by mouth daily.   multivitamin-prenatal 27-0.8 MG Tabs tablet Take 1 tablet by mouth daily at 12 noon.   omeprazole 20 MG capsule Commonly known as: PRILOSEC Take 20 mg by mouth daily.         Follow-up Information     Minda Meo, CNM. Schedule an appointment as soon as possible for a visit in 6 week(s).   Specialty: Certified Nurse Midwife Why: postpartum visit Contact information: [place] [address] [place], [place], CNM. Schedule an appointment as soon as possible for a visit in 2 week(s).   Specialty: Certified Nurse Midwife Why: postpartum mood check Contact information: [place] [address] [phone]                 Signed: Clydene Laming 06/14/2022 9:18 AM

## 2022-06-12 NOTE — Progress Notes (Signed)
L&D Note    Subjective:  Reporting mild contractions, tolerating well  Objective:   Vitals:   06/12/22 1249 06/12/22 1413 06/12/22 1502 06/12/22 1609  BP: 120/78 126/71 (!) 113/50 114/62  Pulse: 77 69 (!) 57 64  Resp:   16   Temp:   98.5 F (36.9 C) 98.7 F (37.1 C)  TempSrc:   Oral Oral  Weight:      Height:        Current Vital Signs 24h Vital Sign Ranges  T 98.7 F (37.1 C) Temp  Avg: 98.6 F (37 C)  Min: 98.1 F (36.7 C)  Max: 98.9 F (37.2 C)  BP 114/62 BP  Min: 113/50  Max: 127/72  HR 64 Pulse  Avg: 67.8  Min: 57  Max: 82  RR 16 Resp  Avg: 17.7  Min: 16  Max: 18  SaO2     No data recorded      Gen: alert, cooperative, no distress FHR: Baseline: 120 bpm, Variability: moderate, Accels: Present, Decels: none Toco: regular, every 2-5 minutes, Mild to moderate SVE: Dilation: 4 Effacement (%): 70 Cervical Position: Anterior Station: -2 Presentation: Vertex Exam by:: Erin Leon, CNM  CBG (last 3)  Recent Labs    06/12/22 0602 06/12/22 1006 06/12/22 1547  GLUCAP 83 81 89    Medications SCHEDULED MEDICATIONS   metFORMIN  500 mg Oral Daily   oxytocin 40 units in LR 1000 mL  333 mL Intravenous Once   pantoprazole  20 mg Oral Daily   prenatal multivitamin  1 tablet Oral Q1200    MEDICATION INFUSIONS   lactated ringers     lactated ringers 125 mL/hr at 06/12/22 1038   oxytocin     oxytocin 10 milli-units/min (06/12/22 1500)    PRN MEDICATIONS  acetaminophen, calcium carbonate, fentaNYL (SUBLIMAZE) injection, lactated ringers, lidocaine (PF), ondansetron, sodium citrate-citric acid, terbutaline, terbutaline   Assessment & Plan:  31 y.o. [id number] at [redacted]w[redacted]d admitted for IOL d/t GDM controlled with metformin  -Labor: SROM during exam at 1601 for clear fluid  -Fetal Well-being: Category I -GBS: negative -Analgesia: unmedicated labor support options - considering epidural   Erin Leon, CNM  06/12/2022 4:26 PM  [facility]

## 2022-06-12 NOTE — Progress Notes (Signed)
Arm shaking

## 2022-06-12 NOTE — Progress Notes (Signed)
L&D Note    Subjective:  Feeling contractions but reports them as mild   Objective:   Vitals:   06/11/22 2241 06/12/22 0302 06/12/22 0716 06/12/22 0805  BP: 125/66 126/74 122/79 125/77  Pulse: (!) 59 63 82 72  Resp: 18 18 18    Temp: 98.9 F (37.2 C) 98.7 F (37.1 C) 98.1 F (36.7 C)   TempSrc: Oral Oral Oral   Weight:      Height:        Current Vital Signs 24h Vital Sign Ranges  T 98.1 F (36.7 C) Temp  Avg: 98.6 F (37 C)  Min: 98.1 F (36.7 C)  Max: 98.9 F (37.2 C)  BP 125/77 BP  Min: 119/70  Max: 126/74  HR 72 Pulse  Avg: 69.7  Min: 59  Max: 82  RR 18 Resp  Avg: 18  Min: 18  Max: 18  SaO2     No data recorded      Gen: alert, cooperative, no distress FHR: Baseline: 120 bpm, Variability: moderate, Accels: Present, Decels: none Toco: irregular, every 2-4 minute, mild to palpation  SVE: Dilation: 2.5 Effacement (%): 70 Cervical Position: Posterior Station: -3 Presentation: Vertex Exam by:: Ardelle Lesches, CNM  Medications SCHEDULED MEDICATIONS   metFORMIN  500 mg Oral Daily   oxytocin 40 units in LR 1000 mL  333 mL Intravenous Once   pantoprazole  20 mg Oral Daily   prenatal multivitamin  1 tablet Oral Q1200    MEDICATION INFUSIONS   lactated ringers     lactated ringers 125 mL/hr at 06/12/22 0230   oxytocin     oxytocin Stopped (06/12/22 1000)    PRN MEDICATIONS  acetaminophen, fentaNYL (SUBLIMAZE) injection, lactated ringers, lidocaine (PF), ondansetron, sodium citrate-citric acid, terbutaline, terbutaline   Assessment & Plan:  31 y.o. [id number] at [redacted]w[redacted]d admitted for IOL d/t GDM on metformin  -Labor: s/p cervical ripening with misoprostol x 2, oxytocin currently at 20 milliunits/min, started 8pm last night.  Will d/c oxytocin for 1 hour, eat breakfast, and then restart oxytocin at 2 milliunits/min  -Fetal Well-being: Category I -GBS: negative -Analgesia: unmedicated labor support options  -Discussed plan of care with Dr. Armen Pickup, CNM   06/12/2022 10:13 AM  [facility]

## 2022-06-13 ENCOUNTER — Encounter: Payer: Self-pay | Admitting: Obstetrics and Gynecology

## 2022-06-13 LAB — CBC
HCT: 30.9 % — ABNORMAL LOW (ref 36.0–46.0)
Hemoglobin: 10.5 g/dL — ABNORMAL LOW (ref 12.0–15.0)
MCH: 27.9 pg (ref 26.0–34.0)
MCHC: 34 g/dL (ref 30.0–36.0)
MCV: 82 fL (ref 80.0–100.0)
Platelets: 199 10*3/uL (ref 150–400)
RBC: 3.77 MIL/uL — ABNORMAL LOW (ref 3.87–5.11)
RDW: 13.8 % (ref 11.5–15.5)
WBC: 12.2 10*3/uL — ABNORMAL HIGH (ref 4.0–10.5)
nRBC: 0 % (ref 0.0–0.2)

## 2022-06-13 LAB — GLUCOSE, CAPILLARY: Glucose-Capillary: 73 mg/dL (ref 70–99)

## 2022-06-13 LAB — SYPHILIS: RPR W/REFLEX TO RPR TITER AND TREPONEMAL ANTIBODIES, TRADITIONAL SCREENING AND DIAGNOSIS ALGORITHM: RPR Ser Ql: NONREACTIVE

## 2022-06-13 MED ORDER — ONDANSETRON HCL 4 MG PO TABS: 4.0000 mg | ORAL_TABLET | ORAL | Status: DC | PRN

## 2022-06-13 MED ORDER — SENNOSIDES-DOCUSATE SODIUM 8.6-50 MG PO TABS
2.0000 | ORAL_TABLET | Freq: Every day | ORAL | Status: DC
  Administered 2022-06-13 – 2022-06-14 (×2): 2 via ORAL
  Filled 2022-06-13 (×2): qty 2

## 2022-06-13 MED ORDER — BENZOCAINE-MENTHOL 20-0.5 % EX AERO
1.0000 | INHALATION_SPRAY | CUTANEOUS | Status: DC | PRN
  Administered 2022-06-13: 1 via TOPICAL
  Filled 2022-06-13: qty 56

## 2022-06-13 MED ORDER — DIBUCAINE (PERIANAL) 1 % EX OINT
1.0000 | TOPICAL_OINTMENT | CUTANEOUS | Status: DC | PRN
  Administered 2022-06-13: 1 via RECTAL
  Filled 2022-06-13: qty 28

## 2022-06-13 MED ORDER — SODIUM CHLORIDE 0.9% FLUSH: 3.0000 mL | INTRAVENOUS | Status: DC | PRN

## 2022-06-13 MED ORDER — ZOLPIDEM TARTRATE 5 MG PO TABS: 5.0000 mg | ORAL_TABLET | Freq: Every evening | ORAL | Status: DC | PRN

## 2022-06-13 MED ORDER — DIPHENHYDRAMINE HCL 25 MG PO CAPS: 25.0000 mg | ORAL_CAPSULE | Freq: Four times a day (QID) | ORAL | Status: DC | PRN

## 2022-06-13 MED ORDER — ONDANSETRON HCL 4 MG/2ML IJ SOLN: 4.0000 mg | INTRAMUSCULAR | Status: DC | PRN

## 2022-06-13 MED ORDER — SODIUM CHLORIDE 0.9 % IV SOLN: 250.0000 mL | INTRAVENOUS | Status: DC | PRN

## 2022-06-13 MED ORDER — COCONUT OIL OIL: 1.0000 | TOPICAL_OIL | Status: DC | PRN

## 2022-06-13 MED ORDER — PANTOPRAZOLE SODIUM 40 MG PO TBEC
40.0000 mg | DELAYED_RELEASE_TABLET | Freq: Every day | ORAL | Status: DC
  Administered 2022-06-13 – 2022-06-14 (×2): 40 mg via ORAL
  Filled 2022-06-13 (×2): qty 1

## 2022-06-13 MED ORDER — FERROUS SULFATE 325 (65 FE) MG PO TABS
325.0000 mg | ORAL_TABLET | Freq: Two times a day (BID) | ORAL | Status: DC
  Administered 2022-06-13 – 2022-06-14 (×3): 325 mg via ORAL
  Filled 2022-06-13 (×3): qty 1

## 2022-06-13 MED ORDER — SODIUM CHLORIDE 0.9% FLUSH: 3.0000 mL | Freq: Two times a day (BID) | INTRAVENOUS | Status: DC

## 2022-06-13 MED ORDER — IBUPROFEN 600 MG PO TABS
600.0000 mg | ORAL_TABLET | Freq: Four times a day (QID) | ORAL | Status: DC
  Administered 2022-06-13 – 2022-06-14 (×6): 600 mg via ORAL
  Filled 2022-06-13 (×6): qty 1

## 2022-06-13 MED ORDER — PRENATAL MULTIVITAMIN CH
1.0000 | ORAL_TABLET | Freq: Every day | ORAL | Status: DC
  Administered 2022-06-13 – 2022-06-14 (×2): 1 via ORAL
  Filled 2022-06-13 (×2): qty 1

## 2022-06-13 MED ORDER — WITCH HAZEL-GLYCERIN EX PADS
1.0000 | MEDICATED_PAD | CUTANEOUS | Status: DC | PRN
  Administered 2022-06-13: 1 via TOPICAL
  Filled 2022-06-13: qty 100

## 2022-06-13 MED ORDER — ACETAMINOPHEN 500 MG PO TABS
1000.0000 mg | ORAL_TABLET | Freq: Four times a day (QID) | ORAL | Status: DC
  Administered 2022-06-13 – 2022-06-14 (×6): 1000 mg via ORAL
  Filled 2022-06-13 (×6): qty 2

## 2022-06-13 MED ORDER — SIMETHICONE 80 MG PO CHEW: 80.0000 mg | CHEWABLE_TABLET | ORAL | Status: DC | PRN

## 2022-06-13 NOTE — Plan of Care (Incomplete)
Transferred to ROom 341 PP. Alert and oriented with quiet

## 2022-06-13 NOTE — Progress Notes (Signed)
Postpartum Day  1  Subjective: no complaints, up ad lib, voiding, and tolerating PO  Doing well, no concerns. Ambulating without difficulty, pain managed with PO meds, tolerating regular diet, and voiding without difficulty.   No fever/chills, chest pain, shortness of breath, nausea/vomiting, or leg pain. No nipple or breast pain. No headache, visual changes, or RUQ/epigastric pain.  Objective: BP 108/68 (BP Location: Right Arm)   Pulse 88   Temp 98 F (36.7 C)   Resp 18   Ht 5\' 3"  (1.6 m)   Wt 111 kg   LMP 09/05/2021 (Approximate)   SpO2 97%   Breastfeeding Unknown   BMI 43.36 kg/m    Physical Exam:  General: alert, cooperative, and no distress Breasts: soft/nontender CV: RRR Pulm: nl effort, CTABL Abdomen: soft, non-tender, active bowel sounds Uterine Fundus: firm Perineum: minimal edema, intact Lochia: appropriate DVT Evaluation: No evidence of DVT seen on physical exam.  Recent Labs    06/11/22 1018 06/13/22 0631  HGB 12.6 10.5*  HCT 36.9 30.9*  WBC 9.4 12.2*  PLT 258 199    Assessment/Plan: 31 y.o. [id number] postpartum day # 1  -Continue routine postpartum care -Lactation consult PRN for breastfeeding  -Acute blood loss anemia - hemodynamically stable and asymptomatic; start PO ferrous sulfate BID with stool softeners  -Immunization status:   all immunizations up to date   Disposition: Continue inpatient postpartum care    LOS: 2 days   Minda Meo, CNM 06/13/2022, 11:22 AM   ----- Drinda Butts  Certified Nurse Midwife [facility]

## 2022-06-13 NOTE — Lactation Note (Signed)
This note was copied from a baby's chart. Lactation Consultation Note  Patient Name: Erin Leon [id number] Date: 06/13/2022 Reason for consult: Initial assessment;Mother's request;Difficult latch;1st time breastfeeding;Term;Nipple pain/trauma;Breastfeeding assistance;RN request Age:33 hours  Maternal Data This is moms 3rd baby, SVD, Mom with a history of Obesity, PPD, and gestational diabetes. Mom reports 2 older children unable to sustain latch, and she pumped for 3 months each time.  Goal this time is to breastfeed and formula feed.   Initial consult today. Mom called requesting latch assistance with concerns about sore nipple and babys latch.  Does the patient have breastfeeding experience prior to this delivery?: Yes (Per moms report, 2 older children, unable to latch, mom pumped for 3 months each time.)  Feeding Mother's Current Feeding Choice: Breast Milk  Assistance mom with maximizing position and latch technique, mom initiated latch in cradle, reports sore and shallow latch. Recommended football hold and mom agreeable, baby able to latch with assistance after a few attempts. Once latched correctly baby sustained latch with swallows and mom reported comfortable latch and no longer in pain.   LATCH Score Latch: Repeated attempts needed to sustain latch, nipple held in mouth throughout feeding, stimulation needed to elicit sucking reflex. (Baby initially had a shallow latch with clicking and lips tucked in, corrected latch several times, and then baby was able to sustain deep latch. Mom reports she was comfortable after corrections made.)  Audible Swallowing: A few with stimulation  Type of Nipple: Everted at rest and after stimulation  Comfort (Breast/Nipple): Filling, red/small blisters or bruises, mild/mod discomfort  Hold (Positioning): Assistance needed to correctly position infant at breast and maintain latch.  LATCH Score: 6  once latch was corrected baby did sustain  latch deeply and rhythmically sucked with audible swallows. Baby appeared to a latch aligned with a 2 rating. (Which may bring score to a 7)   Lactation Tools Discussed/Used Tools: Other (comment) (Lanolin Cream)  Interventions Interventions: Breast feeding basics reviewed;Assisted with latch;Skin to skin;Breast massage;Breast compression;Adjust position;Support pillows;Position options;Education (Lanolin Cream)  Discharge Pump: Manual;Personal (Mom has a spectra DEP and 3 manual hand pumps) WIC Program: Yes  Consult Status Consult Status: Follow-up Date: 06/14/22 Follow-up type: In-patient Update Provided to care nurse    [place] [facility] Lactation Student  06/13/2022, 3:16 PM

## 2022-06-14 MED ORDER — BUTALBITAL-APAP-CAFFEINE 50-325-40 MG PO TABS
1.0000 | ORAL_TABLET | Freq: Once | ORAL | Status: AC
  Administered 2022-06-14: 1 via ORAL
  Filled 2022-06-14: qty 1

## 2022-06-14 NOTE — Progress Notes (Signed)
Patient discharged home with family.  Discharge instructions, when to follow up, and prescriptions reviewed with patient.  Patient verbalized understanding. Patient will be escorted out by auxiliary.   

## 2022-06-14 NOTE — Progress Notes (Signed)
Mom assisted with Breast Pump and she is tolerating well. I also Provided her with a Nipple shield so that she could put Infant to Breast comfortably if she desires. Mom states she fed her older two children pumped Breasts Milk. Mom demonstrates understanding of how to use shield and how to use and clean Public house manager.

## 2022-06-14 NOTE — Lactation Note (Signed)
This note was copied from a baby's chart. Lactation Consultation Note  Patient Name: Erin Leon Date: 06/14/2022 Reason for consult: Follow-up assessment;Term;Nipple pain/trauma Age:31 years  Maternal Data Has patient been taught Hand Expression?: Yes Does the patient have breastfeeding experience prior to this delivery?: Yes How long did the patient breastfeed?: 3 months  Mom is feeling overwhelmed and emotional with breastfeeding. She feels like she can't accomplish her goals with how much pain she is in and with planned discharged later today after baby's circumcision.  Feeding Mother's Current Feeding Choice: Breast Milk and Formula  Baby fed formula overnight due to mom's severe discomfort with nipple pain/damage.  LATCH Score    Lactation Tools Discussed/Used Tools: Pump;Coconut oil;Nipple Shields Nipple shield size:  (unsure of size, mom has not used them) Breast pump type: Double-Electric Breast Pump Reason for Pumping: nipple pain/trauma Pumping frequency: 2 x since set-up  Mom has used the DEBP at bedside only 2x since set-up. We reviewed the importance of frequency and consistency with stimulation to help aid in establishing a plentiful milk supply. Use of DEBP was also an option to stimulate breasts while nipples are healing. Also recommended the use of lanolin or coconut oil while pumping to serve as lubricant between the skin and plastic, and reviewed how to use the pump.  Interventions Interventions: Breast feeding basics reviewed;Hand express;Coconut oil;Shells;Comfort gels;Hand pump;DEBP;Education  Lanolin given, as well as shells for sore nipples this morning. Mom educated on the use of both, and healing properties of expressed colostrum onto the nipple. We discussed use of nipple shield to help ease the discomfort, however mom stated she used this morning and the pain was worse.  Discharge Discharge Education: Engorgement and breast  care;Outpatient recommendation Pump: Manual;Personal (hand pump, evenflo electric, medela hand, spectra (used))  Discussed potential feeding plans: Pumping every 3hours during the day and every 4 at night and giving breasts/nipples a break from baby to allow for healing. Tips given for getting baby back to breasts after nipples have healed with use of nipple shield and outpatient lactation support. Offer breastfeeding 2-3x day, pumping otherwise- also giving a slight break to allow for healing with use of nipple shield, possibly a different size.  Mom has several pump options at home including manual and electric pumps.   Consult Status Consult Status: Complete    Lavonia Drafts 06/14/2022, 10:38 AM

## 2022-06-28 NOTE — Anesthesia Postprocedure Evaluation (Signed)
Anesthesia Post Note  Patient: Erin Leon  Procedure(s) Performed: AN AD HOC LABOR EPIDURAL  Patient location during evaluation: Mother Baby Anesthesia Type: Epidural Pain management: pain level controlled Vital Signs Assessment: post-procedure vital signs reviewed and stable Respiratory status: respiratory function stable Cardiovascular status: stable Postop Assessment: no headache, no backache and epidural receding Anesthetic complications: no Comments: Patient was discharged prior to being seen by the anesthesia care team, but in reviewing the records and talking with the nurse she was doing well and had no complaints prior to discharge.   No notable events documented.   Last Vitals: There were no vitals filed for this visit.  Last Pain: There were no vitals filed for this visit.               Lenard Simmer

## 2022-09-22 IMAGING — MG DIGITAL DIAGNOSTIC BILAT W/ TOMO W/ CAD
6 of 10 series · 6 of 30 positions shown · non-contrast
Comparison: None.

CLINICAL DATA: Baseline mammogram. Clinically appreciate palpable
area at 2 o'clock in the LEFT breast.

EXAM:
DIGITAL DIAGNOSTIC BILATERAL MAMMOGRAM WITH TOMOSYNTHESIS AND CAD;
ULTRASOUND LEFT BREAST LIMITED
TECHNIQUE: Bilateral digital diagnostic mammography and breast tomosynthesis
was performed. The images were evaluated with computer-aided
detection.; Targeted ultrasound examination of the left breast was
performed.

[R MLO synth-2D]
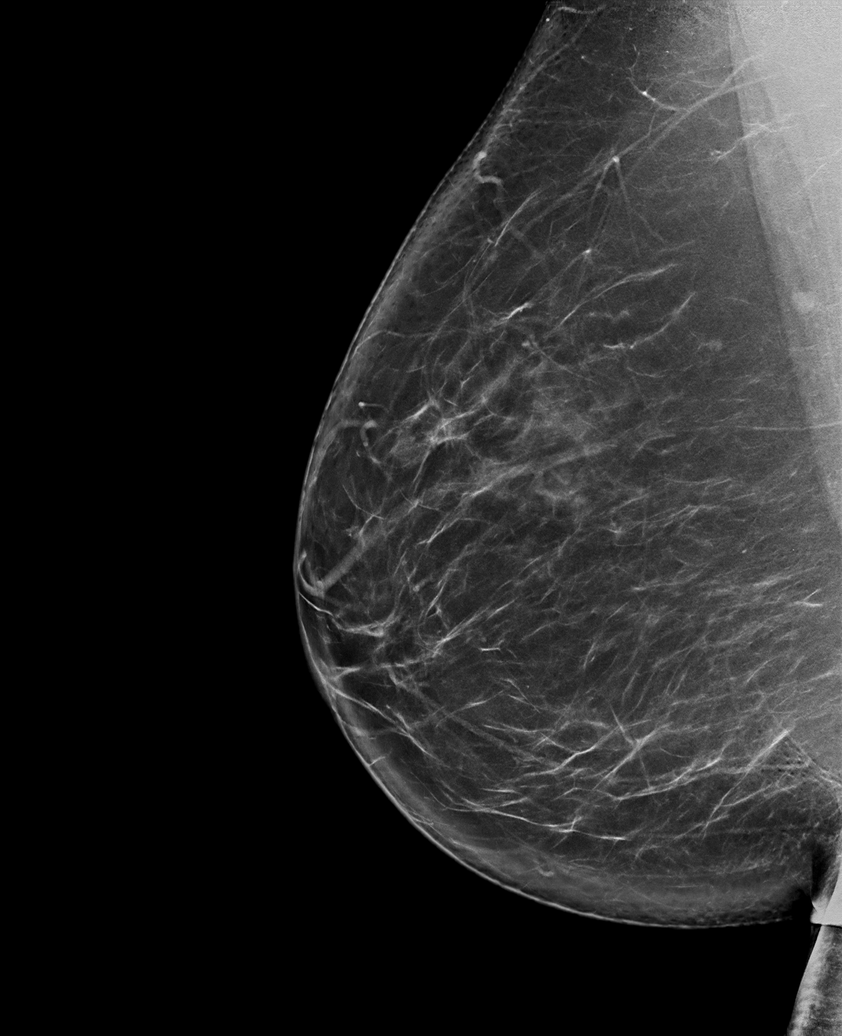

[L MLO synth-2D (1 of 2)]
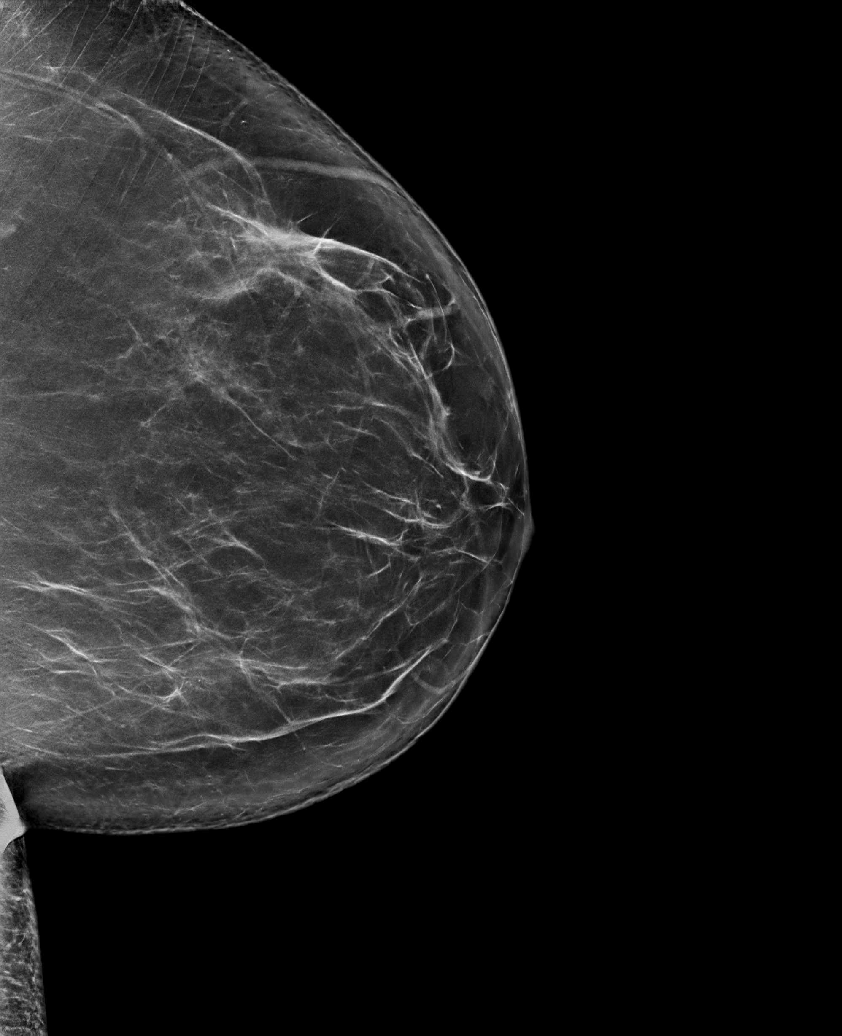

[R CC synth-2D]
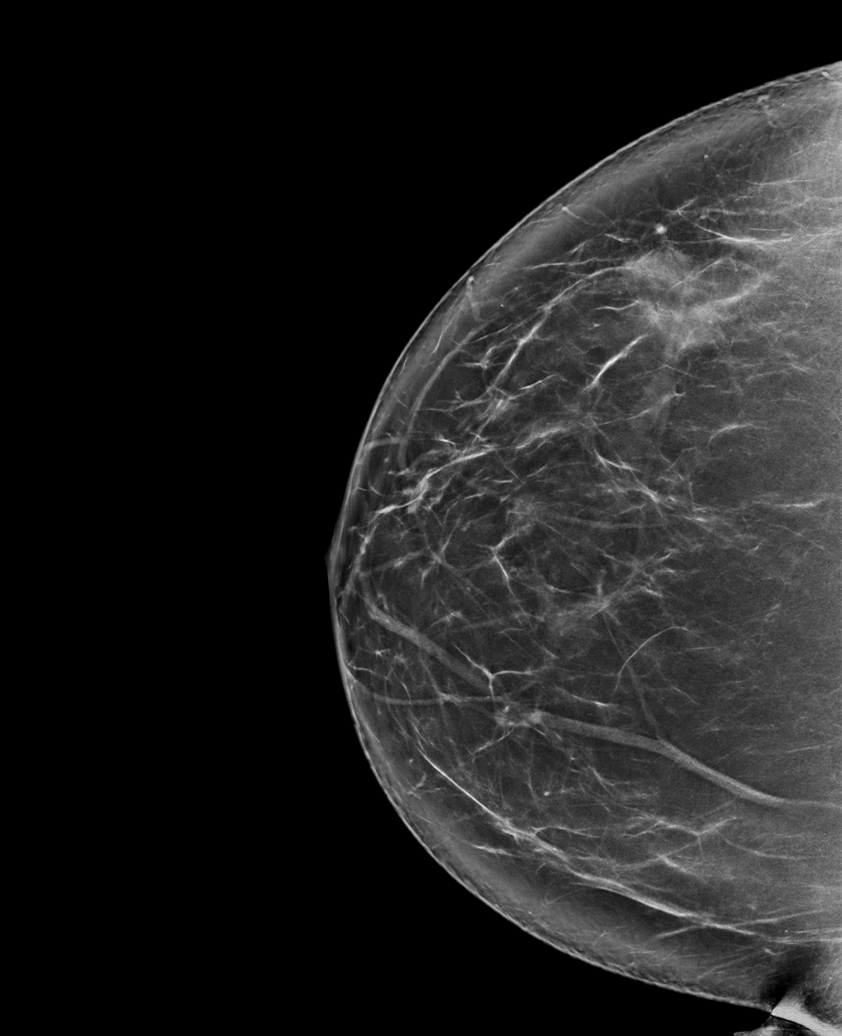

[L MLO synth-2D (2 of 2)]
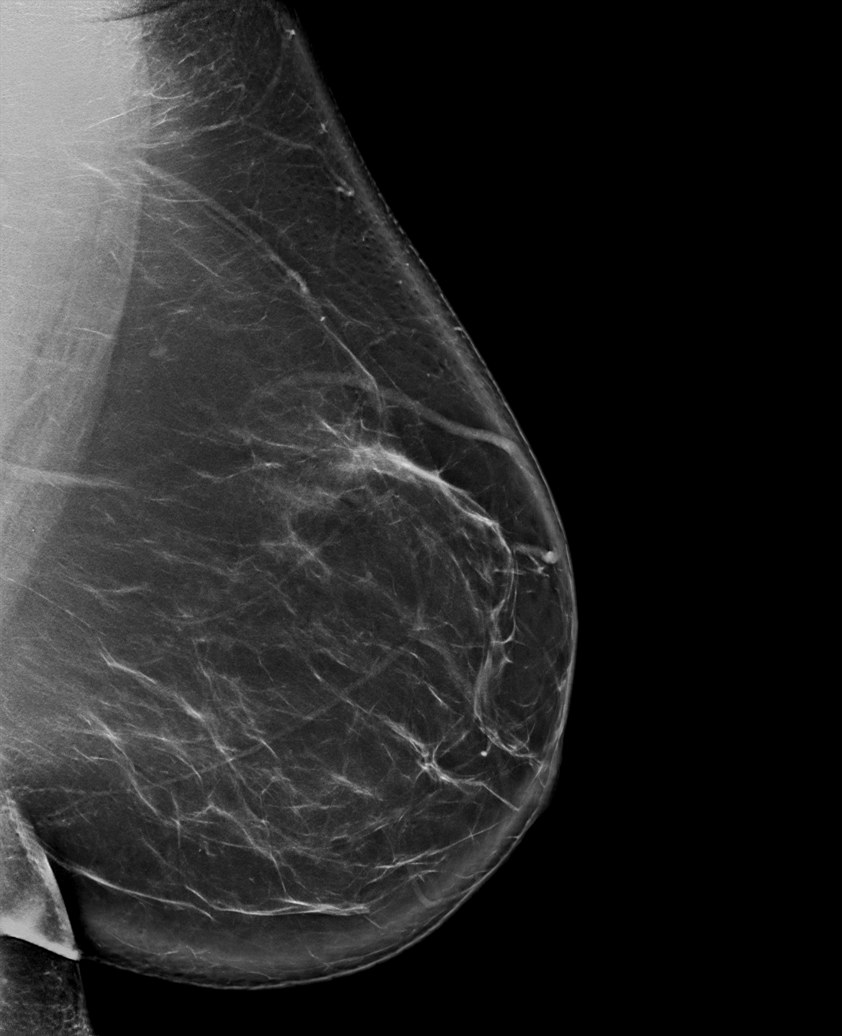

[L CC synth-2D]
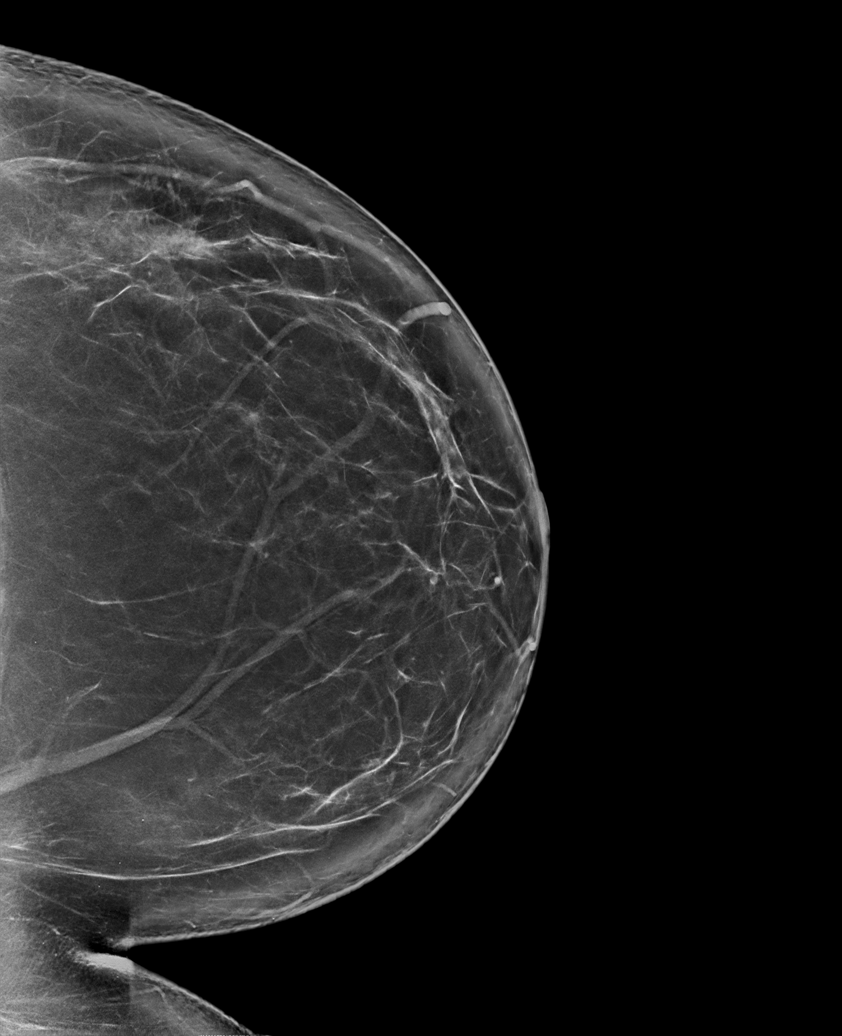

[L MLO tomo · tomo slice 55/110.0]
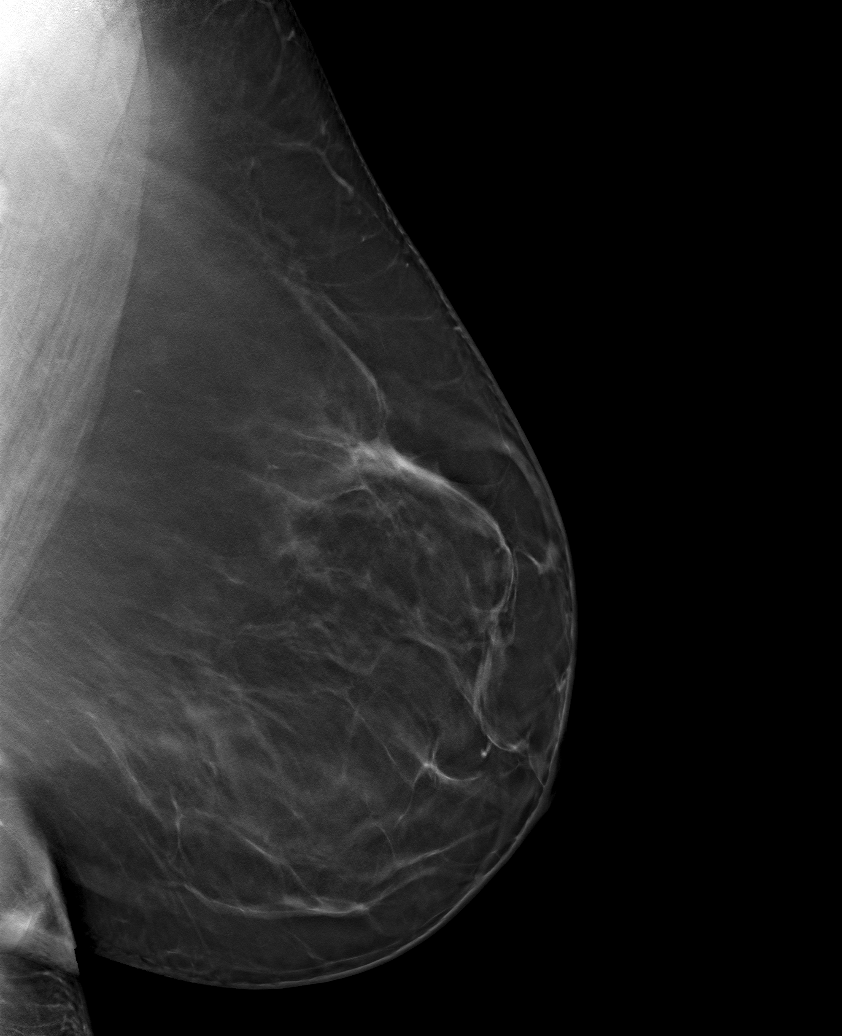

[6 of 30 positions shown; findings below may reference images not displayed]

Baseline

ACR Breast Density Category b: There are scattered areas of
fibroglandular density.
FINDINGS: Diagnostic images were obtained of the site of clinically
appreciated palpable concern in the LEFT upper outer breast. No
suspicious mass is seen in this area. No suspicious mass,
distortion, or microcalcifications are identified to suggest
presence of malignancy bilaterally.

On physical exam, no suspicious mass is appreciated. A prominent
ridge of tissue is appreciated in the LEFT upper outer breast.

Targeted ultrasound was performed of the site of palpable concern in
the LEFT upper outer breast. No suspicious cystic or solid mass is
seen.
IMPRESSION: 1. No mammographic or sonographic evidence of malignancy at the site
of clinically appreciated palpable concern in the LEFT upper outer
breast. Any further workup of the patient's symptoms should be based
on the clinical assessment. Recommend routine annual screening
mammogram at the age of 40 unless there are intervening clinical
concerns.
2. No mammographic evidence of malignancy bilaterally.

RECOMMENDATION:
Screening mammogram at age 40 unless there are persistent or
intervening clinical concerns. (Code:MA-D-X5L)

I have discussed the findings and recommendations with the patient.
If applicable, a reminder letter will be sent to the patient
regarding the next appointment.

BI-RADS CATEGORY  1: Negative.
